# Patient Record
Sex: Male | Born: 1955 | Race: Black or African American | Hispanic: No | Marital: Married | Smoking: Former smoker
Health system: Southern US, Community
[De-identification: ages and names within clinical notes are randomized; demographics above are authoritative.]

## PROBLEM LIST (undated history)

## (undated) DIAGNOSIS — I639 Cerebral infarction, unspecified: Secondary | ICD-10-CM

## (undated) DIAGNOSIS — I739 Peripheral vascular disease, unspecified: Secondary | ICD-10-CM

## (undated) DIAGNOSIS — N183 Chronic kidney disease, stage 3 unspecified: Secondary | ICD-10-CM

## (undated) DIAGNOSIS — R0600 Dyspnea, unspecified: Secondary | ICD-10-CM

## (undated) DIAGNOSIS — N189 Chronic kidney disease, unspecified: Secondary | ICD-10-CM

## (undated) DIAGNOSIS — E119 Type 2 diabetes mellitus without complications: Secondary | ICD-10-CM

## (undated) DIAGNOSIS — E785 Hyperlipidemia, unspecified: Secondary | ICD-10-CM

## (undated) DIAGNOSIS — I10 Essential (primary) hypertension: Secondary | ICD-10-CM

## (undated) DIAGNOSIS — I429 Cardiomyopathy, unspecified: Secondary | ICD-10-CM

## (undated) DIAGNOSIS — E039 Hypothyroidism, unspecified: Secondary | ICD-10-CM

## (undated) HISTORY — PX: TOE AMPUTATION: SHX809

## (undated) HISTORY — PX: BACK SURGERY: SHX140

---

## 2004-05-21 ENCOUNTER — Ambulatory Visit: Payer: Self-pay | Admitting: Internal Medicine

## 2004-06-25 ENCOUNTER — Ambulatory Visit: Payer: Self-pay | Admitting: Internal Medicine

## 2004-09-23 ENCOUNTER — Other Ambulatory Visit: Payer: Self-pay

## 2004-09-23 ENCOUNTER — Emergency Department: Payer: Self-pay | Admitting: Emergency Medicine

## 2004-10-01 ENCOUNTER — Ambulatory Visit: Payer: Self-pay | Admitting: Pain Medicine

## 2004-10-04 ENCOUNTER — Ambulatory Visit: Payer: Self-pay | Admitting: Pain Medicine

## 2004-10-05 ENCOUNTER — Ambulatory Visit: Payer: Self-pay | Admitting: Pain Medicine

## 2004-10-16 ENCOUNTER — Ambulatory Visit: Payer: Self-pay | Admitting: Pain Medicine

## 2004-11-05 ENCOUNTER — Ambulatory Visit: Payer: Self-pay | Admitting: Physician Assistant

## 2004-11-06 ENCOUNTER — Ambulatory Visit: Payer: Self-pay

## 2004-11-09 ENCOUNTER — Ambulatory Visit: Payer: Self-pay | Admitting: Internal Medicine

## 2004-11-13 ENCOUNTER — Ambulatory Visit: Payer: Self-pay | Admitting: Pain Medicine

## 2004-11-27 ENCOUNTER — Ambulatory Visit: Payer: Self-pay | Admitting: Pain Medicine

## 2004-12-11 ENCOUNTER — Ambulatory Visit: Payer: Self-pay | Admitting: Physician Assistant

## 2004-12-18 ENCOUNTER — Ambulatory Visit: Payer: Self-pay | Admitting: Pain Medicine

## 2004-12-20 ENCOUNTER — Ambulatory Visit: Payer: Self-pay | Admitting: Internal Medicine

## 2004-12-31 ENCOUNTER — Ambulatory Visit: Payer: Self-pay | Admitting: Physician Assistant

## 2005-01-09 ENCOUNTER — Encounter: Payer: Self-pay | Admitting: Pain Medicine

## 2005-01-10 ENCOUNTER — Ambulatory Visit: Payer: Self-pay | Admitting: Pain Medicine

## 2005-02-06 ENCOUNTER — Ambulatory Visit: Payer: Self-pay | Admitting: Physician Assistant

## 2005-03-08 ENCOUNTER — Ambulatory Visit: Payer: Self-pay | Admitting: Physician Assistant

## 2005-03-27 ENCOUNTER — Other Ambulatory Visit: Payer: Self-pay

## 2005-04-02 ENCOUNTER — Inpatient Hospital Stay: Payer: Self-pay | Admitting: Unknown Physician Specialty

## 2005-04-25 ENCOUNTER — Emergency Department: Payer: Self-pay | Admitting: Internal Medicine

## 2005-04-26 ENCOUNTER — Other Ambulatory Visit: Payer: Self-pay

## 2005-06-30 ENCOUNTER — Other Ambulatory Visit: Payer: Self-pay

## 2005-06-30 ENCOUNTER — Emergency Department: Payer: Self-pay | Admitting: Internal Medicine

## 2005-07-08 ENCOUNTER — Ambulatory Visit: Payer: Self-pay | Admitting: Physician Assistant

## 2005-07-11 ENCOUNTER — Ambulatory Visit: Payer: Self-pay | Admitting: Pain Medicine

## 2005-07-17 ENCOUNTER — Ambulatory Visit: Payer: Self-pay | Admitting: Physician Assistant

## 2005-07-26 ENCOUNTER — Ambulatory Visit: Payer: Self-pay | Admitting: Physician Assistant

## 2005-09-10 ENCOUNTER — Encounter: Payer: Self-pay | Admitting: Unknown Physician Specialty

## 2005-09-19 ENCOUNTER — Encounter: Payer: Self-pay | Admitting: Unknown Physician Specialty

## 2005-10-20 ENCOUNTER — Encounter: Payer: Self-pay | Admitting: Unknown Physician Specialty

## 2005-10-23 ENCOUNTER — Ambulatory Visit: Payer: Self-pay | Admitting: Physician Assistant

## 2005-11-15 ENCOUNTER — Ambulatory Visit: Payer: Self-pay | Admitting: Internal Medicine

## 2005-11-19 ENCOUNTER — Encounter: Payer: Self-pay | Admitting: Unknown Physician Specialty

## 2005-12-02 ENCOUNTER — Other Ambulatory Visit: Payer: Self-pay

## 2005-12-27 ENCOUNTER — Ambulatory Visit: Payer: Self-pay | Admitting: Podiatry

## 2006-02-12 ENCOUNTER — Ambulatory Visit: Payer: Self-pay | Admitting: Internal Medicine

## 2006-02-20 ENCOUNTER — Ambulatory Visit: Payer: Self-pay | Admitting: Physician Assistant

## 2006-03-26 ENCOUNTER — Ambulatory Visit: Payer: Self-pay | Admitting: Internal Medicine

## 2006-12-13 IMAGING — CR DG CHEST 2V
1 series · 2 of 2 positions shown · non-contrast
Comparison: none

REASON FOR EXAM: Diabetes
COMMENTS:

PROCEDURE:     DXR - DXR CHEST PA (OR AP) AND LATERAL  - March 27, 2005  [DATE]
RESULT:     Two views of the chest show the lungs are clear.  The heart and
pulmonary vessels are normal.  The bony and mediastinal structures are
unremarkable.  There is no significant change since 09/23/04.

[Series 1312: postero_anterior · 0.11mm/px · 2 of 2 slices shown]
[im 1/2]
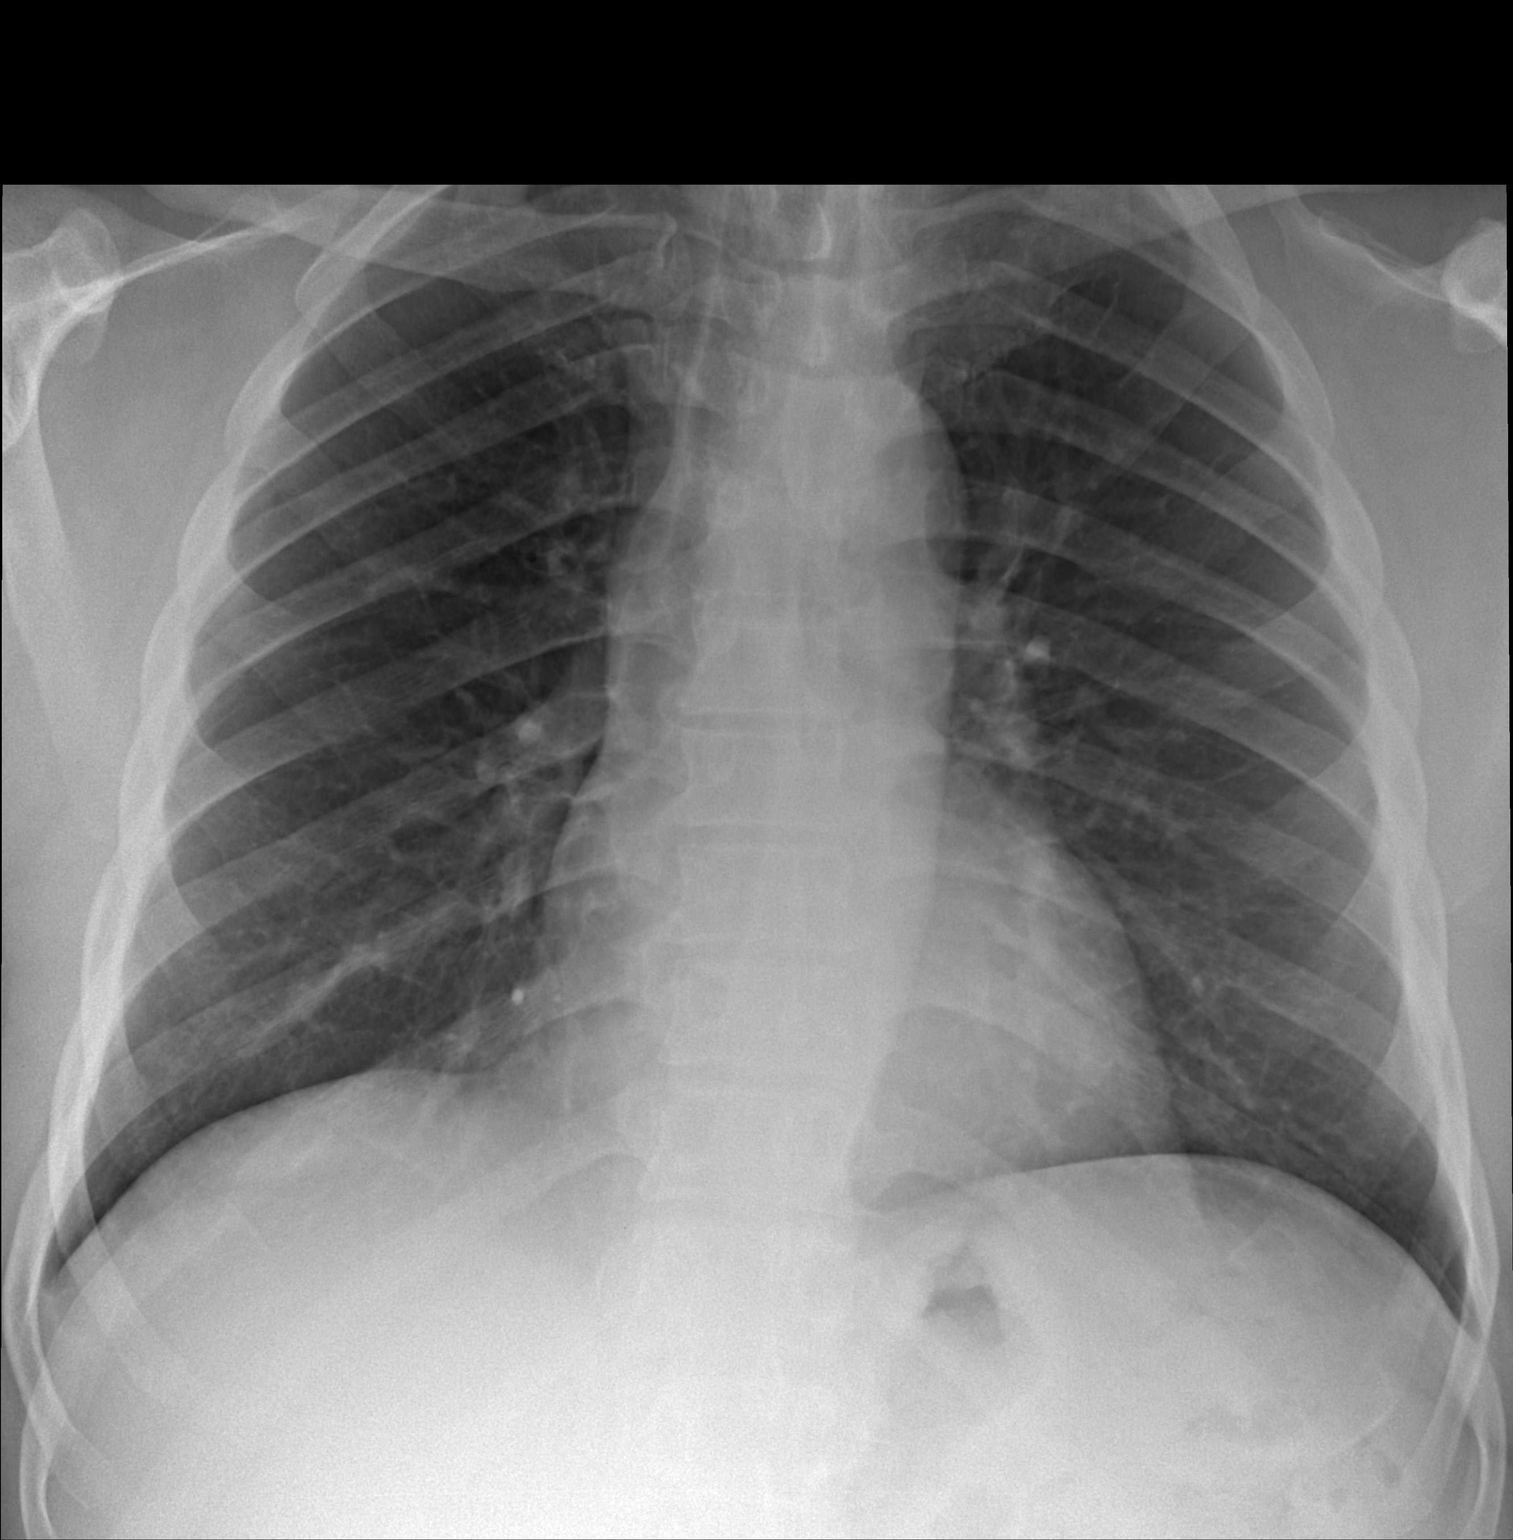
[im 2/2]
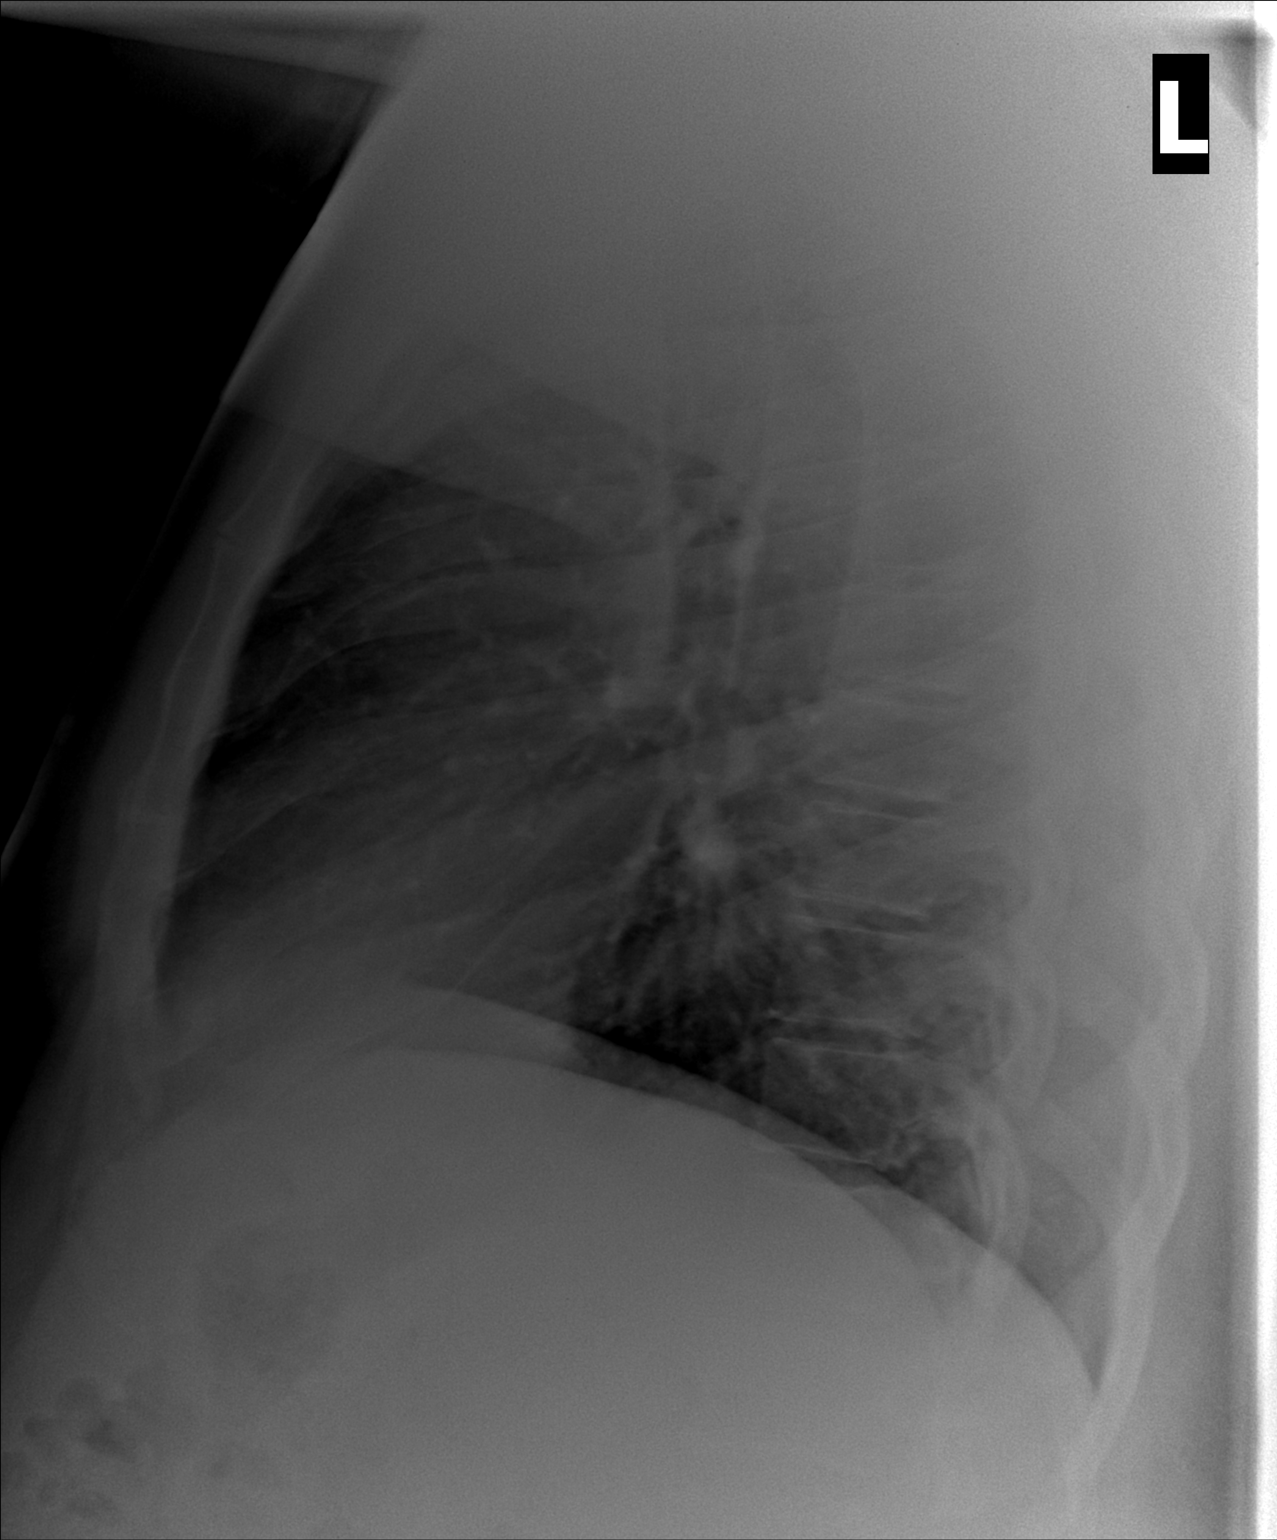

[2 of 2 positions shown; findings below may reference images not displayed]

IMPRESSION: No acute cardiopulmonary disease.

## 2007-08-03 IMAGING — RF DG UGI W/O KUB
2 series · 15 of 19 positions shown · non-contrast
Comparison: none

REASON FOR EXAM: Dysphagia
COMMENTS:

[Series 1: run · 14 of 18 slices shown]
[im 1/18]
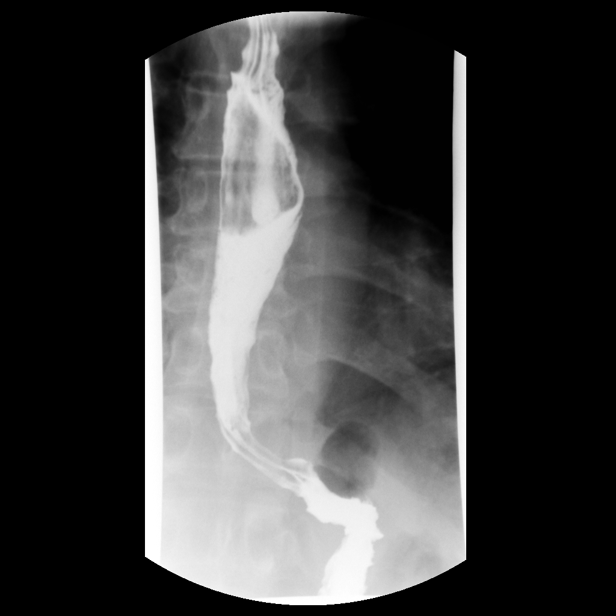
[im 2/18]
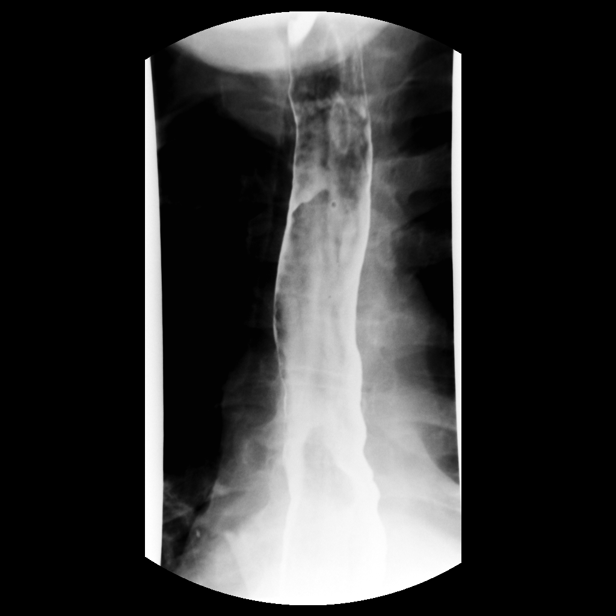
[im 4/18]
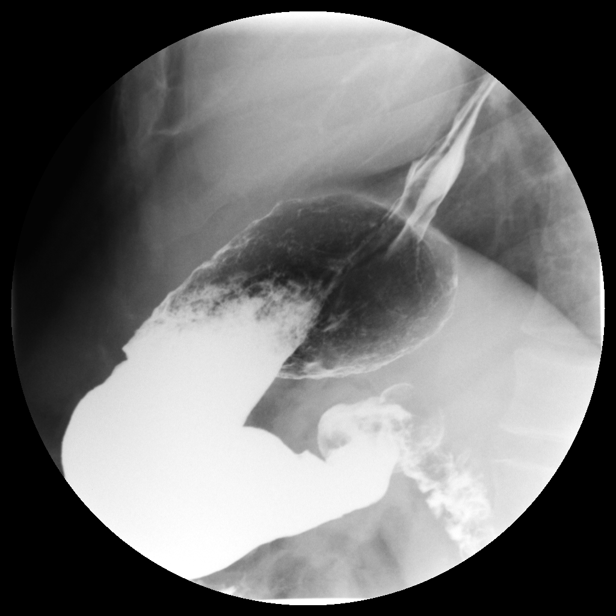
[im 5/18]
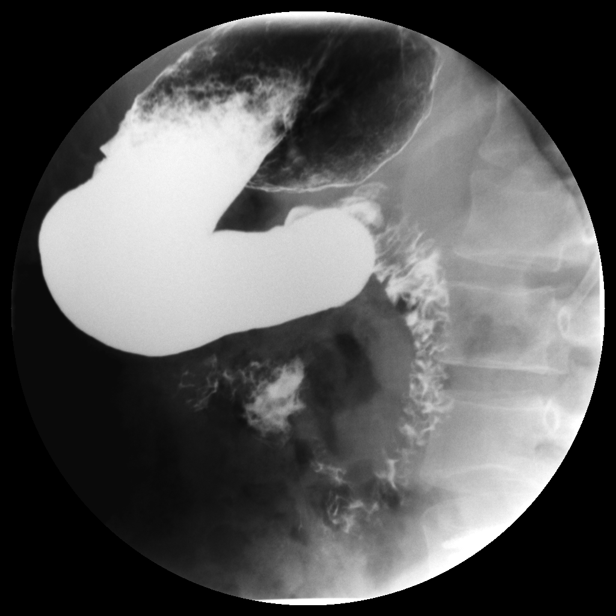
[im 6/18]
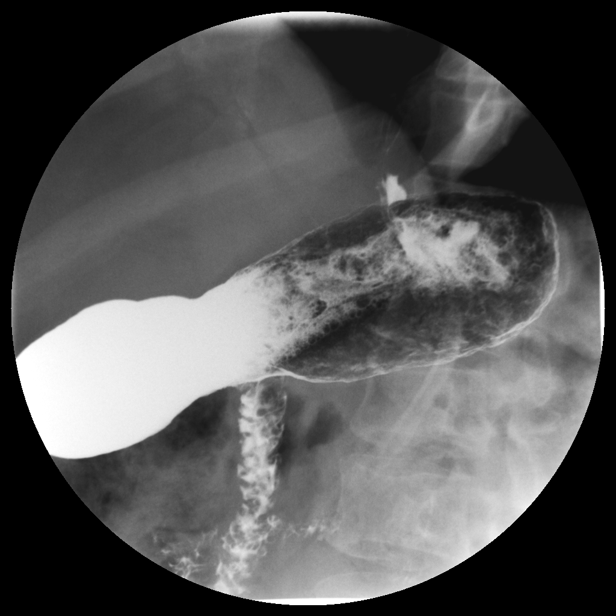
[im 7/18]
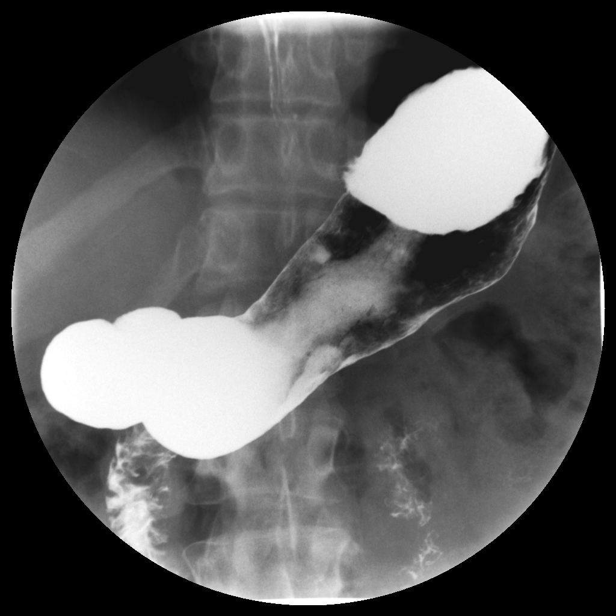
[im 9/18]
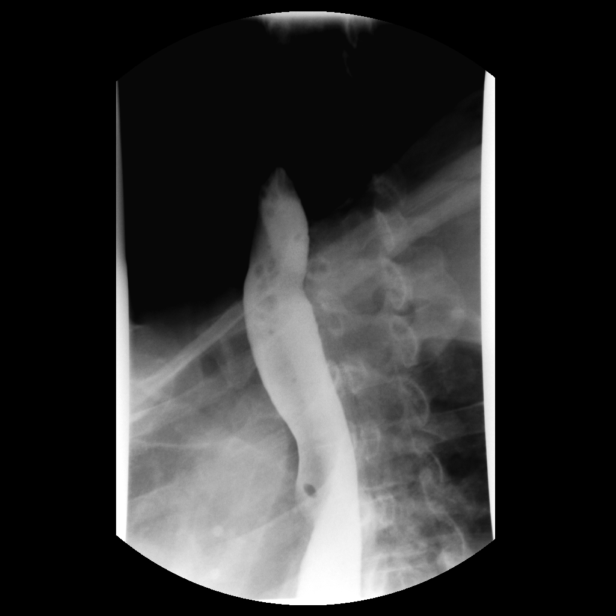
[im 10/18]
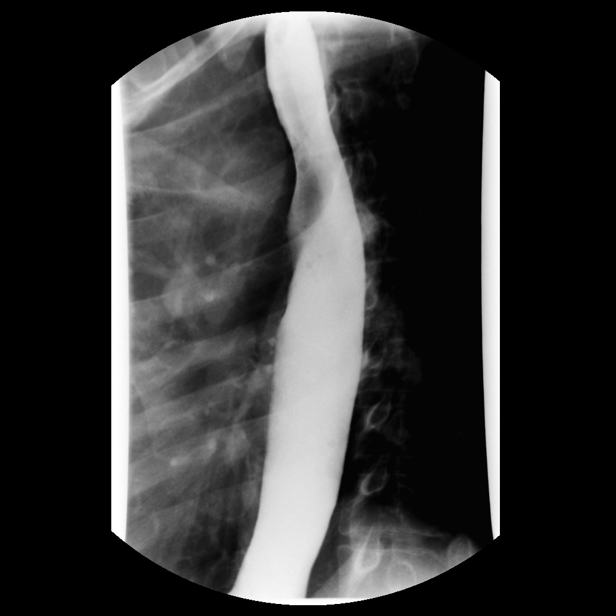
[im 11/18]
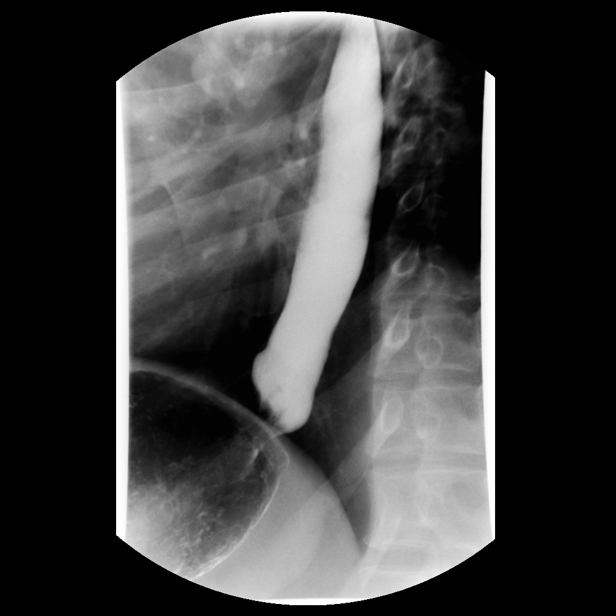
[im 13/18]
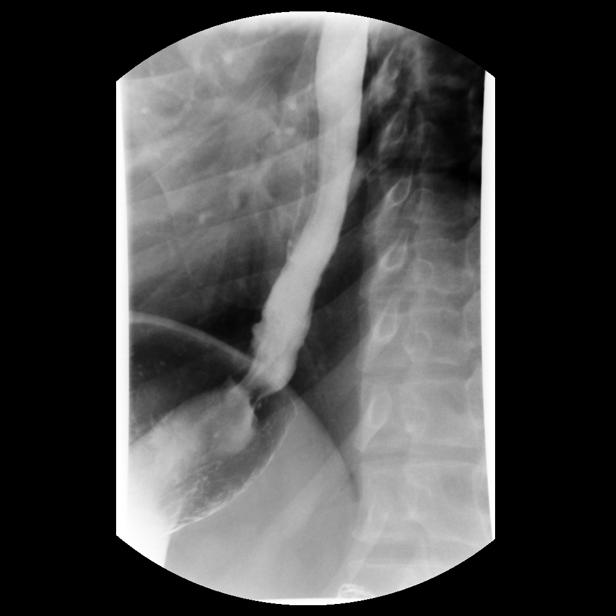
[im 14/18]
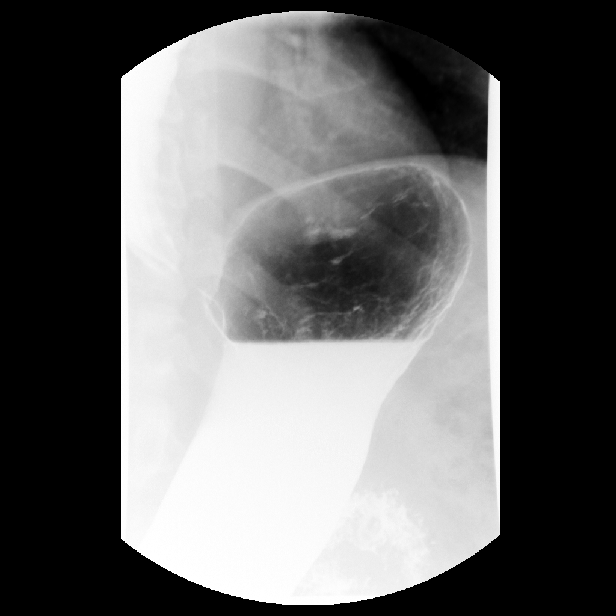
[im 15/18]
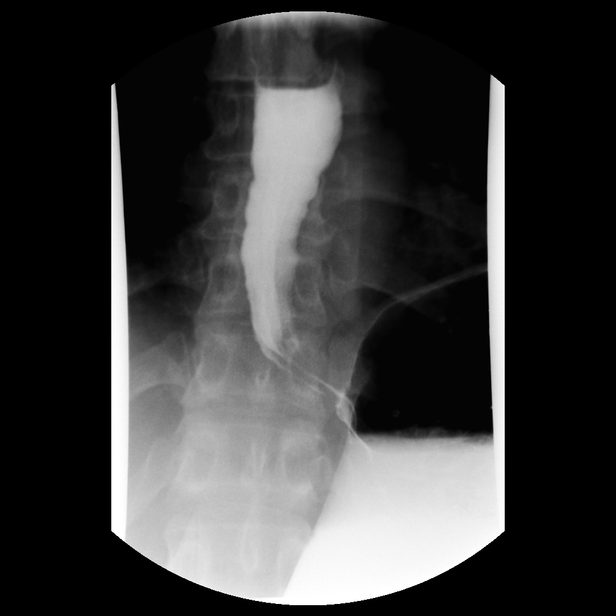
[im 16/18]
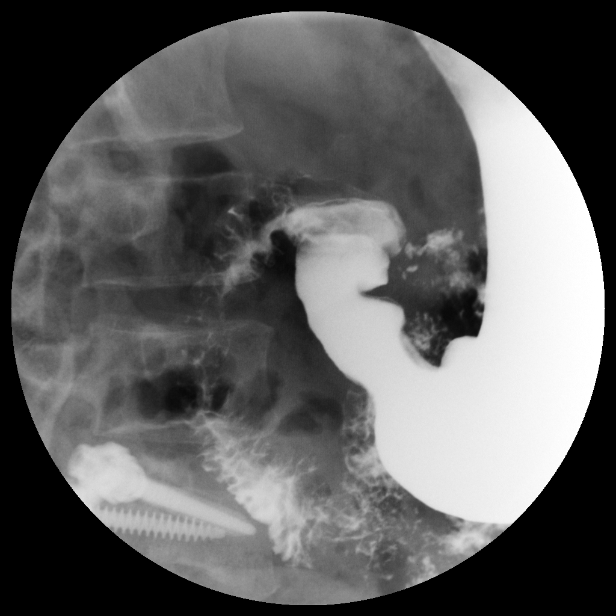
[im 18/18]
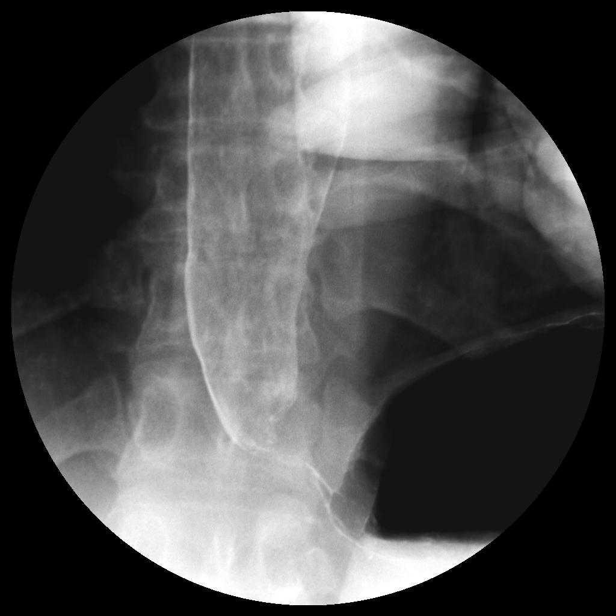

[Series 1: view not recorded · 0.17mm/px · 1 of 1 slices shown]
[im 1/1]
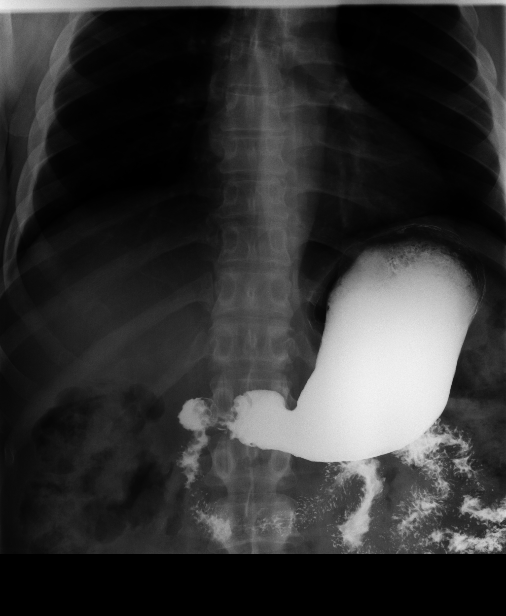

[15 of 19 positions shown; findings below may reference images not displayed]

PROCEDURE:     FL  - FL UPPER GI  - November 15, 2005  [DATE]

RESULT:          Double contrast upper GI examination demonstrates no
evidence of hiatal hernia or gastroesophageal reflux.  The esophagus is
mildly distended.  There was significant spasm in the distal esophagus to
mid esophagus, but no definite mucosal abnormality is seen.  A 12.5 mm
barium-impregnated tablet passed easily through the esophagus into the
stomach, demonstrating no significant stenosis.  The study shows some
residual ingested food within the stomach which could represent some delayed
gastric transit.  Correlation with the history would be recommended.
IMPRESSION: Possible delayed gastric emptying, given there appeared to
be some residual gastric content present.  There is some spasm in the distal
esophagus to mid esophageal region, but a 12.5 mm barium-impregnated tablet
passed into the stomach without delay.

## 2008-05-23 ENCOUNTER — Emergency Department: Payer: Self-pay | Admitting: Emergency Medicine

## 2009-12-07 ENCOUNTER — Ambulatory Visit: Payer: Self-pay | Admitting: Podiatry

## 2010-12-06 ENCOUNTER — Ambulatory Visit: Payer: Self-pay | Admitting: Ophthalmology

## 2010-12-19 ENCOUNTER — Ambulatory Visit: Payer: Self-pay | Admitting: Ophthalmology

## 2011-01-22 ENCOUNTER — Ambulatory Visit: Payer: Self-pay | Admitting: Ophthalmology

## 2011-05-28 ENCOUNTER — Ambulatory Visit: Payer: Self-pay | Admitting: Ophthalmology

## 2011-05-29 ENCOUNTER — Inpatient Hospital Stay: Payer: Self-pay | Admitting: Podiatry

## 2012-02-04 ENCOUNTER — Ambulatory Visit: Payer: Self-pay | Admitting: Ophthalmology

## 2012-02-12 ENCOUNTER — Ambulatory Visit: Payer: Self-pay | Admitting: Ophthalmology

## 2013-12-06 DIAGNOSIS — R0602 Shortness of breath: Secondary | ICD-10-CM | POA: Insufficient documentation

## 2014-06-02 DIAGNOSIS — E0590 Thyrotoxicosis, unspecified without thyrotoxic crisis or storm: Secondary | ICD-10-CM | POA: Insufficient documentation

## 2014-11-08 NOTE — Op Note (Signed)
PATIENT NAME:  Bruce Bartlett, Bruce Bartlett MR#:  [id number] DATE OF BIRTH:  05-Feb-1956  DATE OF PROCEDURE:  02/12/2012  PROCEDURES PERFORMED:  1. Pars plana vitrectomy of the right eye.  2. Endolaser of the right eye.  3. Tractional retinal detachment repair of the right eye.  4. Phacoemulsification intraocular lens insertion of the right eye.  5. Gas exchange of the right eye.   PREOPERATIVE DIAGNOSES:  1. Proliferative diabetic retinopathy.  2. Epiretinal membrane.  3. Visually significant cataract.   POSTOPERATIVE DIAGNOSES:  1. Proliferative diabetic retinopathy.  2. Tractional retinal detachment.  3. Visually significant cataract.  PRIMARY SURGEON: Bruce Bartlett. Bruce Buege, MD   ANESTHESIA: Retrobulbar block of the right eye with monitored anesthesia care.   COMPLICATIONS: None.   INDICATIONS FOR PROCEDURE: This is a patient who has been under my care for several months. The patient has presented with proliferative diabetic retinopathy of the right eye. The patient underwent panretinal photocoagulation with development of a tractional retinal detachment in response. Risks, benefits, and alternatives of the above procedure were discussed and the patient wished to proceed.   DETAILS OF PROCEDURE: After informed consent was obtained, the patient was brought into the operative suite at [facility]. The patient was placed in the supine position and was given a small dose of propofol and a retrobulbar block was performed on the right eye by the primary surgeon without any complications. The right eye was prepped and draped in a sterile manner. After lid speculum was inserted, a side-port wound was created at approximately 10:30 in a clear corneal fashion. DisCoVisc was injected into the anterior chamber to maintain it. Keratome blade was used to create a main corneal wound at approximately 12 o'clock. Cystotome was introduced in the eye and a continuous 360 degree anterior  capsulorrhexis was created. The lens was hydrodissected using BSS on a 26-gauge cannula and rotated for 180 degrees. Phacoemulsification wand was introduced in the eye and the eye was broken into four quadrants and removed without any complications. INA was used to remove the cortical material without any complication. DisCoVisc was injected into the capsular bag. A 15 diopter SN60WF lens, serial #[id number] was introduced into the capsular bag and rotated into position. The DisCoVisc was removed using INA and the wounds were hydrated using BSS and were noted to be watertight. Attention was turned to the pars plana vitrectomy portion of the case.   A 23-gauge trocar was placed inferotemporally through displaced conjunctiva in an oblique fashion 3 mm beyond the limbus. The infusion cannula was turned on and inserted through the trocar and secured in position with Steri-Strips. Two more trocars were placed in a similar fashion superotemporally and superonasally. The vitreous cutter and light pipe were introduced in the eye and a core vitrectomy was performed. The vitreous base was noted to be plastered to the retina for 360 degrees. The posterior hyaloid was extremely tight and elevated off of the macula. This area was carefully incised superior to the macula in order to create access to the posterior side of the vitreous base. Given the extreme tightness, there was plenty of room to do so. Using a combination of vitreous cutter, 23-gauge vertical scissors and pick, the multiple proliferative plaques were dissected and isolated. Two of them were noted to be extreme plates and were trimmed down to their smallest amount and broken into pieces but small remnants were left behind because of the risk of creating extremely large retinal tears. All traction appeared to  be alleviated. There was one small retinal tear noted inferior to the optic nerve secondary to removal of proliferative plaque. Panretinal photocoagulation  was then performed for 360 degrees. An air-fluid exchange was performed. The fluid was removed from under the original tear and three rows of laser was placed around this tear. Once this was completed, no further tears could be identified. 22% SF6 was used as an Systems developerair-gas exchange. The trocars were removed and 6-0 plain gut was used to close two of the wounds due to air leak. SF6 was used to pressurize the eye to 15 mmHg. 5 mg of dexamethasone was then given into the inferior fornix. The lid speculum was removed and then Cosopt and TobraDex was placed in the eye. The eye was cleaned. A patch and shield was placed over the eye and the patient was taken to postanesthesia care with instructions to remain face down.   ____________________________ Bruce FellingMatthew F. Champ MungoAppenzeller, MD mfa:drc D: 02/12/2012 09:30:29 ET T: 02/12/2012 10:35:04 ET JOB#: [id number] Bruce CoolsMATTHEW F Marea Reasner MD ELECTRONICALLY SIGNED 03/11/2012 12:35

## 2016-01-17 DIAGNOSIS — Z89421 Acquired absence of other right toe(s): Secondary | ICD-10-CM | POA: Insufficient documentation

## 2016-03-20 ENCOUNTER — Other Ambulatory Visit: Payer: Self-pay | Admitting: Neurology

## 2016-03-20 DIAGNOSIS — M21371 Foot drop, right foot: Secondary | ICD-10-CM

## 2016-03-20 DIAGNOSIS — N183 Chronic kidney disease, stage 3 unspecified: Secondary | ICD-10-CM | POA: Insufficient documentation

## 2016-03-20 DIAGNOSIS — R269 Unspecified abnormalities of gait and mobility: Secondary | ICD-10-CM

## 2016-04-02 ENCOUNTER — Ambulatory Visit: Payer: Self-pay

## 2016-04-03 ENCOUNTER — Ambulatory Visit
Admission: RE | Admit: 2016-04-03 | Discharge: 2016-04-03 | Disposition: A | Discharge status: Home / Self Care | Payer: Medicare Other | Source: Ambulatory Visit | Attending: Neurology | Admitting: Neurology

## 2016-04-03 DIAGNOSIS — Z8673 Personal history of transient ischemic attack (TIA), and cerebral infarction without residual deficits: Secondary | ICD-10-CM | POA: Insufficient documentation

## 2016-04-03 DIAGNOSIS — M21371 Foot drop, right foot: Secondary | ICD-10-CM | POA: Diagnosis present

## 2016-04-03 DIAGNOSIS — R269 Unspecified abnormalities of gait and mobility: Secondary | ICD-10-CM | POA: Diagnosis present

## 2016-05-18 ENCOUNTER — Encounter: Payer: Self-pay | Admitting: Emergency Medicine

## 2016-05-18 ENCOUNTER — Inpatient Hospital Stay
Admission: EM | Admit: 2016-05-18 | Discharge: 2016-05-21 | DRG: 309 | Disposition: A | Discharge status: Home / Self Care | Payer: Medicare Other | Attending: Internal Medicine | Admitting: Internal Medicine

## 2016-05-18 ENCOUNTER — Emergency Department: Payer: Medicare Other

## 2016-05-18 DIAGNOSIS — I10 Essential (primary) hypertension: Secondary | ICD-10-CM | POA: Diagnosis present

## 2016-05-18 DIAGNOSIS — Z87891 Personal history of nicotine dependence: Secondary | ICD-10-CM

## 2016-05-18 DIAGNOSIS — I69351 Hemiplegia and hemiparesis following cerebral infarction affecting right dominant side: Secondary | ICD-10-CM | POA: Diagnosis not present

## 2016-05-18 DIAGNOSIS — E785 Hyperlipidemia, unspecified: Secondary | ICD-10-CM | POA: Diagnosis present

## 2016-05-18 DIAGNOSIS — R001 Bradycardia, unspecified: Secondary | ICD-10-CM | POA: Diagnosis present

## 2016-05-18 DIAGNOSIS — Z794 Long term (current) use of insulin: Secondary | ICD-10-CM | POA: Diagnosis not present

## 2016-05-18 DIAGNOSIS — R42 Dizziness and giddiness: Secondary | ICD-10-CM

## 2016-05-18 DIAGNOSIS — Z8673 Personal history of transient ischemic attack (TIA), and cerebral infarction without residual deficits: Secondary | ICD-10-CM | POA: Diagnosis not present

## 2016-05-18 DIAGNOSIS — K5900 Constipation, unspecified: Secondary | ICD-10-CM | POA: Diagnosis present

## 2016-05-18 DIAGNOSIS — E119 Type 2 diabetes mellitus without complications: Secondary | ICD-10-CM | POA: Diagnosis present

## 2016-05-18 DIAGNOSIS — E860 Dehydration: Secondary | ICD-10-CM | POA: Diagnosis present

## 2016-05-18 DIAGNOSIS — R1110 Vomiting, unspecified: Secondary | ICD-10-CM

## 2016-05-18 DIAGNOSIS — A084 Viral intestinal infection, unspecified: Secondary | ICD-10-CM | POA: Diagnosis present

## 2016-05-18 DIAGNOSIS — N179 Acute kidney failure, unspecified: Secondary | ICD-10-CM | POA: Diagnosis present

## 2016-05-18 DIAGNOSIS — R066 Hiccough: Secondary | ICD-10-CM | POA: Diagnosis present

## 2016-05-18 HISTORY — DX: Hyperlipidemia, unspecified: E78.5

## 2016-05-18 HISTORY — DX: Type 2 diabetes mellitus without complications: E11.9

## 2016-05-18 HISTORY — DX: Cerebral infarction, unspecified: I63.9

## 2016-05-18 HISTORY — DX: Essential (primary) hypertension: I10

## 2016-05-18 LAB — CBC
HCT: 43.9 % (ref 40.0–52.0)
Hemoglobin: 14.3 g/dL (ref 13.0–18.0)
MCH: 24.1 pg — ABNORMAL LOW (ref 26.0–34.0)
MCHC: 32.5 g/dL (ref 32.0–36.0)
MCV: 74.2 fL — AB (ref 80.0–100.0)
PLATELETS: 210 10*3/uL (ref 150–440)
RBC: 5.91 MIL/uL — AB (ref 4.40–5.90)
RDW: 16.6 % — ABNORMAL HIGH (ref 11.5–14.5)
WBC: 10.7 10*3/uL — AB (ref 3.8–10.6)

## 2016-05-18 LAB — LIPASE, BLOOD: Lipase: 17 U/L (ref 11–51)

## 2016-05-18 LAB — BASIC METABOLIC PANEL
Anion gap: 8 (ref 5–15)
BUN: 30 mg/dL — ABNORMAL HIGH (ref 6–20)
CALCIUM: 9.4 mg/dL (ref 8.9–10.3)
CHLORIDE: 101 mmol/L (ref 101–111)
CO2: 28 mmol/L (ref 22–32)
CREATININE: 1.47 mg/dL — AB (ref 0.61–1.24)
GFR, EST AFRICAN AMERICAN: 58 mL/min — AB (ref >60)
GFR, EST NON AFRICAN AMERICAN: 50 mL/min — AB (ref >60)
Glucose, Bld: 347 mg/dL — ABNORMAL HIGH (ref 65–99)
Potassium: 3.8 mmol/L (ref 3.5–5.1)
SODIUM: 137 mmol/L (ref 135–145)

## 2016-05-18 LAB — URINALYSIS COMPLETE WITH MICROSCOPIC (ARMC ONLY)
BILIRUBIN URINE: NEGATIVE
Bacteria, UA: NONE SEEN
KETONES UR: NEGATIVE mg/dL
LEUKOCYTES UA: NEGATIVE
NITRITE: NEGATIVE
Protein, ur: >500 mg/dL — AB
SPECIFIC GRAVITY, URINE: 1.025 (ref 1.005–1.030)
pH: 5 (ref 5.0–8.0)

## 2016-05-18 LAB — GLUCOSE, CAPILLARY
GLUCOSE-CAPILLARY: 228 mg/dL — AB (ref 65–99)
Glucose-Capillary: 347 mg/dL — ABNORMAL HIGH (ref 65–99)

## 2016-05-18 LAB — TROPONIN I
TROPONIN I: 0.08 ng/mL — AB (ref <0.03)
Troponin I: 0.07 ng/mL (ref <0.03)

## 2016-05-18 MED ORDER — SODIUM CHLORIDE 0.9 % IV BOLUS (SEPSIS)
1000.0000 mL | Freq: Once | INTRAVENOUS | Status: AC
  Administered 2016-05-18: 1000 mL via INTRAVENOUS

## 2016-05-18 MED ORDER — ONDANSETRON 4 MG PO TBDP
ORAL_TABLET | ORAL | Status: AC
  Filled 2016-05-18: qty 1

## 2016-05-18 MED ORDER — ONDANSETRON 4 MG PO TBDP
4.0000 mg | ORAL_TABLET | Freq: Once | ORAL | Status: AC | PRN
  Administered 2016-05-18: 4 mg via ORAL

## 2016-05-18 MED ORDER — INSULIN ASPART 100 UNIT/ML ~~LOC~~ SOLN
6.0000 [IU] | Freq: Once | SUBCUTANEOUS | Status: AC
  Administered 2016-05-18: 6 [IU] via INTRAVENOUS
  Filled 2016-05-18: qty 6

## 2016-05-18 NOTE — ED Triage Notes (Signed)
Pt arrived via POV with a friend. Pt states yesterday he has been dizzy and weak since last night. Pt reports vomiting x 3 today. Denies any pain.  Pt is a diabetic. Pt states his last blood sugar was 259 this afternoon and he did take insulin then.  Pt states dizziness is worse when lying down.

## 2016-05-18 NOTE — ED Notes (Signed)
Patient given diet ginger ale per request.  

## 2016-05-18 NOTE — ED Provider Notes (Signed)
[facility] Emergency Department Provider Note  Time seen: 8:02 PM  I have reviewed the triage vital signs and the nursing notes.   HISTORY  Chief Complaint Dizziness and Weakness    HPI Bruce Bartlett is a 60 y.o. male with a past medical history of diabetes, hyperlipidemia, hypertension, CVA affecting the right lower extremity who presents the emergency department for generalized weakness and dizziness. According to the patient for the past2-3 days he has been feeling generalized weakness and dizziness. States he uses a cane at baseline due to a prior stroke and right lower extremity weakness, but states he has been feeling more off-balance recently. He is also been having trouble controlling his blood sugars. Denies any new focal weakness or numbness. Denies any headache. Patient is quite hypertensive in the emergency department, states he takes blood pressure medications and has not missed any doses recently. Patient describes nausea, had 3 episodes of vomiting today. Denies any abdominal pain or diarrhea. Denies dysuria but states he has been urinating very frequently.  Past Medical History:  Diagnosis Date  . Diabetes mellitus without complication (HCC)   . Hyperlipidemia   . Hypertension   . Stroke [facility](HCC)     There are no active problems to display for this patient.   Past Surgical History:  Procedure Laterality Date  . BACK SURGERY      Prior to Admission medications   Not on File    No Known Allergies  History reviewed. No pertinent family history.  Social History Social History  Substance Use Topics  . Smoking status: Former Games developermoker  . Smokeless tobacco: Never Used  . Alcohol use No    Review of Systems Constitutional: Negative for fever. Cardiovascular: Negative for chest pain. Respiratory: Negative for shortness of breath. Gastrointestinal: Negative for abdominal pain Neurological: Negative for headache 10-point ROS otherwise  negative.  ____________________________________________   PHYSICAL EXAM:  VITAL SIGNS: ED Triage Vitals  Enc Vitals Group     BP 05/18/16 1758 (S) (!) 215/82     Pulse Rate 05/18/16 1758 68     Resp 05/18/16 1758 18     Temp 05/18/16 1758 98.4 F (36.9 C)     Temp Source 05/18/16 1758 Oral     SpO2 05/18/16 1758 100 %     Weight 05/18/16 1758 249 lb (112.9 kg)     Height 05/18/16 1758 6\' 1"  (1.854 m)     Head Circumference --      Peak Flow --      Pain Score 05/18/16 1759 0     Pain Loc --      Pain Edu? --      Excl. in GC? --     Constitutional: Alert and oriented. Well appearing and in no distress. Eyes: Normal exam ENT   Head: Normocephalic and atraumatic.   Mouth/Throat: Mucous membranes are moist. Cardiovascular: Normal rate, regular rhythm. No murmur Respiratory: Normal respiratory effort without tachypnea nor retractions. Breath sounds are clear  Gastrointestinal: Soft and nontender. No distention. Musculoskeletal: Nontender with normal range of motion in all extremities. Neurologic:  Normal speech and language. 4/5 strength in right lower extremity, otherwise 5/5 motor in all extremities. No pronator drift. Patient does have right lower extremity drift which again is baseline. No cranial nerve deficits. Skin:  Skin is warm, dry and intact.  Psychiatric: Mood and affect are normal.   ____________________________________________    EKG  EKG reviewed and interpreted by myself shows sinus rhythm at  60 bpm, narrow QRS, left axis deviation, largely normal intervals besides a prolonged PR interval consistent with first-degree AV block, nonspecific but no concerning ST changes.  ____________________________________________    RADIOLOGY  CT head negative for acute intracranial abnormality  ____________________________________________   INITIAL IMPRESSION / ASSESSMENT AND PLAN / ED COURSE  Pertinent labs & imaging results that were available during my  care of the patient were reviewed by me and considered in my medical decision making (see chart for details).  The patient presents the emergency department for dizziness and generalized weakness. Patient is hypertensive in the emergency department. We'll obtain a CT scan of the head. On exam the patient does have right lower extremity weakness as his only deficit which she states is baseline. Patient's labs show an elevated blood glucose. We will dose insulin, IV fluids and closely monitor in the emergency department. The patient is agreeable to this plan. I also added on a troponin.  Patient's troponin is elevated 0.07. Blood glucose is down to 228.  Patient continues to appear well the emergency department. Patient did however have a brief run of bradycardia in the 30s, and then a sustained bradycardia in the low to mid 40s. This could very likely be the cause of the patient's weakness/dizziness. Given his bradycardia and possible symptomatic bradycardia will admit to the hospital for further workup and treatment. At this time it is unclear what medications the patient takes at home, this could possibly be overmedication with a beta or calcium channel blocker.  ____________________________________________   FINAL CLINICAL IMPRESSION(S) / ED DIAGNOSES  Dizziness  Generalized weakness Symptomatic bradycardia   Minna AntisKevin Melvina Pangelinan, MD 05/18/16 2155

## 2016-05-19 ENCOUNTER — Inpatient Hospital Stay: Payer: Medicare Other

## 2016-05-19 ENCOUNTER — Inpatient Hospital Stay
Admit: 2016-05-19 | Discharge: 2016-05-19 | Disposition: A | Discharge status: Home / Self Care | Payer: Medicare Other | Attending: Family Medicine | Admitting: Family Medicine

## 2016-05-19 LAB — TROPONIN I
TROPONIN I: 0.08 ng/mL — AB (ref <0.03)
TROPONIN I: 0.08 ng/mL — AB (ref <0.03)
TROPONIN I: 0.05 ng/mL — AB (ref <0.03)

## 2016-05-19 LAB — GLUCOSE, CAPILLARY
GLUCOSE-CAPILLARY: 259 mg/dL — AB (ref 65–99)
GLUCOSE-CAPILLARY: 167 mg/dL — AB (ref 65–99)
GLUCOSE-CAPILLARY: 155 mg/dL — AB (ref 65–99)
GLUCOSE-CAPILLARY: 180 mg/dL — AB (ref 65–99)
Glucose-Capillary: 227 mg/dL — ABNORMAL HIGH (ref 65–99)
Glucose-Capillary: 248 mg/dL — ABNORMAL HIGH (ref 65–99)
Glucose-Capillary: 269 mg/dL — ABNORMAL HIGH (ref 65–99)

## 2016-05-19 LAB — BASIC METABOLIC PANEL
ANION GAP: 8 (ref 5–15)
BUN: 30 mg/dL — ABNORMAL HIGH (ref 6–20)
CALCIUM: 8.9 mg/dL (ref 8.9–10.3)
CHLORIDE: 103 mmol/L (ref 101–111)
CO2: 26 mmol/L (ref 22–32)
Creatinine, Ser: 1.48 mg/dL — ABNORMAL HIGH (ref 0.61–1.24)
GFR calc Af Amer: 57 mL/min — ABNORMAL LOW (ref >60)
GFR calc non Af Amer: 49 mL/min — ABNORMAL LOW (ref >60)
GLUCOSE: 243 mg/dL — AB (ref 65–99)
Potassium: 3.7 mmol/L (ref 3.5–5.1)
Sodium: 137 mmol/L (ref 135–145)

## 2016-05-19 LAB — CBC
HCT: 42.1 % (ref 40.0–52.0)
HEMOGLOBIN: 14.1 g/dL (ref 13.0–18.0)
MCH: 24.9 pg — AB (ref 26.0–34.0)
MCHC: 33.4 g/dL (ref 32.0–36.0)
MCV: 74.5 fL — AB (ref 80.0–100.0)
Platelets: 221 10*3/uL (ref 150–440)
RBC: 5.65 MIL/uL (ref 4.40–5.90)
RDW: 16.5 % — ABNORMAL HIGH (ref 11.5–14.5)
WBC: 10.4 10*3/uL (ref 3.8–10.6)

## 2016-05-19 LAB — PROTIME-INR
INR: 1.23
Prothrombin Time: 15.6 seconds — ABNORMAL HIGH (ref 11.4–15.2)

## 2016-05-19 LAB — LIPID PANEL
CHOL/HDL RATIO: 3.5 ratio
CHOLESTEROL: 147 mg/dL (ref 0–200)
HDL: 42 mg/dL (ref >40)
LDL Cholesterol: 93 mg/dL (ref 0–99)
Triglycerides: 61 mg/dL (ref <150)
VLDL: 12 mg/dL (ref 0–40)

## 2016-05-19 LAB — TSH: TSH: 1.15 u[IU]/mL (ref 0.350–4.500)

## 2016-05-19 LAB — HEPARIN LEVEL (UNFRACTIONATED): HEPARIN UNFRACTIONATED: 0.8 [IU]/mL — AB (ref 0.30–0.70)

## 2016-05-19 LAB — MAGNESIUM: Magnesium: 2.1 mg/dL (ref 1.7–2.4)

## 2016-05-19 LAB — ECHOCARDIOGRAM COMPLETE
HEIGHTINCHES: 73 in
Weight: 3755.2 oz

## 2016-05-19 LAB — APTT: aPTT: 31 seconds (ref 24–36)

## 2016-05-19 LAB — PHOSPHORUS: Phosphorus: 3.6 mg/dL (ref 2.5–4.6)

## 2016-05-19 MED ORDER — ATORVASTATIN CALCIUM 20 MG PO TABS
40.0000 mg | ORAL_TABLET | Freq: Every day | ORAL | Status: DC
  Administered 2016-05-19 – 2016-05-21 (×3): 40 mg via ORAL
  Filled 2016-05-19 (×3): qty 2

## 2016-05-19 MED ORDER — HYDRALAZINE HCL 50 MG PO TABS
50.0000 mg | ORAL_TABLET | Freq: Three times a day (TID) | ORAL | Status: DC
  Administered 2016-05-19: 50 mg via ORAL
  Filled 2016-05-19: qty 1

## 2016-05-19 MED ORDER — BISACODYL 5 MG PO TBEC: 5.0000 mg | DELAYED_RELEASE_TABLET | Freq: Every day | ORAL | Status: DC | PRN

## 2016-05-19 MED ORDER — ACETAMINOPHEN 325 MG PO TABS: 650.0000 mg | ORAL_TABLET | Freq: Four times a day (QID) | ORAL | Status: DC | PRN

## 2016-05-19 MED ORDER — HYDROCODONE-ACETAMINOPHEN 5-325 MG PO TABS: 1.0000 | ORAL_TABLET | ORAL | Status: DC | PRN

## 2016-05-19 MED ORDER — HYDRALAZINE HCL 50 MG PO TABS
100.0000 mg | ORAL_TABLET | Freq: Four times a day (QID) | ORAL | Status: DC
  Administered 2016-05-19 – 2016-05-21 (×7): 100 mg via ORAL
  Filled 2016-05-19 (×7): qty 2

## 2016-05-19 MED ORDER — OMEGA-3-ACID ETHYL ESTERS 1 G PO CAPS
1.0000 g | ORAL_CAPSULE | Freq: Every day | ORAL | Status: DC
  Administered 2016-05-19 – 2016-05-21 (×3): 1 g via ORAL
  Filled 2016-05-19 (×3): qty 1

## 2016-05-19 MED ORDER — INSULIN ASPART 100 UNIT/ML ~~LOC~~ SOLN
0.0000 [IU] | Freq: Four times a day (QID) | SUBCUTANEOUS | Status: DC
  Administered 2016-05-19: 3 [IU] via SUBCUTANEOUS
  Administered 2016-05-19: 100 [IU] via SUBCUTANEOUS
  Administered 2016-05-19: 2 [IU] via SUBCUTANEOUS
  Administered 2016-05-20: 9 [IU] via SUBCUTANEOUS
  Administered 2016-05-20: 3 [IU] via SUBCUTANEOUS
  Filled 2016-05-19: qty 3
  Filled 2016-05-19: qty 2
  Filled 2016-05-19: qty 3
  Filled 2016-05-19: qty 9
  Filled 2016-05-19: qty 2

## 2016-05-19 MED ORDER — ONDANSETRON HCL 4 MG PO TABS: 4.0000 mg | ORAL_TABLET | Freq: Four times a day (QID) | ORAL | Status: DC | PRN

## 2016-05-19 MED ORDER — HYDRALAZINE HCL 50 MG PO TABS
100.0000 mg | ORAL_TABLET | Freq: Three times a day (TID) | ORAL | Status: DC
  Administered 2016-05-19: 100 mg via ORAL
  Filled 2016-05-19: qty 2

## 2016-05-19 MED ORDER — ENOXAPARIN SODIUM 40 MG/0.4ML ~~LOC~~ SOLN
40.0000 mg | SUBCUTANEOUS | Status: DC
  Administered 2016-05-19 – 2016-05-21 (×3): 40 mg via SUBCUTANEOUS
  Filled 2016-05-19 (×3): qty 0.4

## 2016-05-19 MED ORDER — SENNOSIDES-DOCUSATE SODIUM 8.6-50 MG PO TABS: 1.0000 | ORAL_TABLET | Freq: Every evening | ORAL | Status: DC | PRN

## 2016-05-19 MED ORDER — INSULIN ASPART 100 UNIT/ML ~~LOC~~ SOLN
0.0000 [IU] | SUBCUTANEOUS | Status: DC
  Administered 2016-05-19: 100 [IU] via SUBCUTANEOUS
  Filled 2016-05-19: qty 11

## 2016-05-19 MED ORDER — ZOLPIDEM TARTRATE 5 MG PO TABS: 5.0000 mg | ORAL_TABLET | Freq: Every evening | ORAL | Status: DC | PRN

## 2016-05-19 MED ORDER — AMLODIPINE BESYLATE 10 MG PO TABS
10.0000 mg | ORAL_TABLET | Freq: Every day | ORAL | Status: DC
  Administered 2016-05-19 – 2016-05-21 (×3): 10 mg via ORAL
  Filled 2016-05-19 (×3): qty 1

## 2016-05-19 MED ORDER — LATANOPROST 0.005 % OP SOLN
1.0000 [drp] | Freq: Every day | OPHTHALMIC | Status: DC
  Administered 2016-05-19 – 2016-05-20 (×3): 1 [drp] via OPHTHALMIC
  Filled 2016-05-19: qty 2.5

## 2016-05-19 MED ORDER — SODIUM CHLORIDE 0.9 % IV SOLN
INTRAVENOUS | Status: DC
  Administered 2016-05-19: 02:00:00 via INTRAVENOUS

## 2016-05-19 MED ORDER — ENOXAPARIN SODIUM 40 MG/0.4ML ~~LOC~~ SOLN
40.0000 mg | Freq: Every day | SUBCUTANEOUS | Status: DC
  Administered 2016-05-19: 40 mg via SUBCUTANEOUS
  Filled 2016-05-19: qty 0.4

## 2016-05-19 MED ORDER — HEPARIN (PORCINE) IN NACL 100-0.45 UNIT/ML-% IJ SOLN
1200.0000 [IU]/h | INTRAMUSCULAR | Status: DC
  Administered 2016-05-19: 1200 [IU]/h via INTRAVENOUS
  Filled 2016-05-19: qty 250

## 2016-05-19 MED ORDER — HYDROCHLOROTHIAZIDE 25 MG PO TABS
25.0000 mg | ORAL_TABLET | Freq: Every day | ORAL | Status: DC
  Administered 2016-05-19 – 2016-05-21 (×3): 25 mg via ORAL
  Filled 2016-05-19 (×3): qty 1

## 2016-05-19 MED ORDER — HYDRALAZINE HCL 20 MG/ML IJ SOLN
10.0000 mg | Freq: Four times a day (QID) | INTRAMUSCULAR | Status: DC | PRN
  Administered 2016-05-19 – 2016-05-20 (×4): 10 mg via INTRAVENOUS
  Filled 2016-05-19 (×5): qty 1

## 2016-05-19 MED ORDER — ACETAMINOPHEN 650 MG RE SUPP: 650.0000 mg | Freq: Four times a day (QID) | RECTAL | Status: DC | PRN

## 2016-05-19 MED ORDER — ASPIRIN 325 MG PO TABS
325.0000 mg | ORAL_TABLET | Freq: Once | ORAL | Status: AC
  Administered 2016-05-19: 325 mg via ORAL
  Filled 2016-05-19: qty 1

## 2016-05-19 MED ORDER — VITAMIN B-12 1000 MCG PO TABS
1000.0000 ug | ORAL_TABLET | Freq: Every day | ORAL | Status: DC
  Administered 2016-05-19 – 2016-05-21 (×3): 1000 ug via ORAL
  Filled 2016-05-19 (×3): qty 1

## 2016-05-19 MED ORDER — MAGNESIUM CITRATE PO SOLN: 1.0000 | Freq: Once | ORAL | Status: DC | PRN

## 2016-05-19 MED ORDER — ISOSORBIDE MONONITRATE ER 30 MG PO TB24
30.0000 mg | ORAL_TABLET | Freq: Every day | ORAL | Status: DC
  Administered 2016-05-19 – 2016-05-21 (×3): 30 mg via ORAL
  Filled 2016-05-19 (×3): qty 1

## 2016-05-19 MED ORDER — LOSARTAN POTASSIUM 50 MG PO TABS
100.0000 mg | ORAL_TABLET | Freq: Every day | ORAL | Status: DC
  Administered 2016-05-19 – 2016-05-21 (×3): 100 mg via ORAL
  Filled 2016-05-19 (×3): qty 2

## 2016-05-19 MED ORDER — METOCLOPRAMIDE HCL 5 MG/ML IJ SOLN
5.0000 mg | Freq: Three times a day (TID) | INTRAMUSCULAR | Status: DC
  Administered 2016-05-19 – 2016-05-20 (×5): 5 mg via INTRAVENOUS
  Filled 2016-05-19 (×6): qty 2

## 2016-05-19 MED ORDER — ONDANSETRON HCL 4 MG/2ML IJ SOLN
4.0000 mg | Freq: Four times a day (QID) | INTRAMUSCULAR | Status: DC | PRN
  Administered 2016-05-19: 4 mg via INTRAVENOUS
  Filled 2016-05-19: qty 2

## 2016-05-19 NOTE — Progress Notes (Signed)
ANTICOAGULATION CONSULT NOTE - Initial Consult  Pharmacy Consult for heparin Indication: chest pain/ACS  No Known Allergies  Patient Measurements: Height: 6\' 1"  (185.4 cm) Weight: 234 lb 11.2 oz (106.5 kg) IBW/kg (Calculated) : 79.9 Heparin Dosing Weight: 103.8 kg  Vital Signs: Temp: 97.4 F (36.3 C) (10/29 0045) Temp Source: Oral (10/29 0045) BP: 192/91 (10/29 0048) Pulse Rate: 76 (10/29 0048)  Labs:  Recent Labs  05/18/16 1806 05/18/16 2133  HGB 14.3  --   HCT 43.9  --   PLT 210  --   CREATININE 1.47*  --   TROPONINI 0.07* 0.08*    Estimated Creatinine Clearance: 67.6 mL/min (by C-G formula based on SCr of 1.47 mg/dL (H)).   Medical History: Past Medical History:  Diagnosis Date  . Diabetes mellitus without complication (HCC)   . Hyperlipidemia   . Hypertension   . Stroke [facility](HCC)     Medications:  Infusions:  . sodium chloride 75 mL/hr at 05/19/16 0133  . heparin      Assessment: 60 yom cc dizziness/weakness with hx CVA HLD HTN DM, had some mild chest pain today. Troponin 0.07 >> 0.08. Pharmacy consulted to dose heparin for ACS. Patient did receive LMWH 40 mg subcutaneously at 0132 today. PTA medication list doesn't include AC.    Goal of Therapy:  Heparin level 0.3-0.7 units/ml Monitor platelets by anticoagulation protocol: Yes   Plan:  Start heparin infusion at 1200 units/hr Check anti-Xa level in 6 hours and daily while on heparin Continue to monitor H&H and platelets  Carola FrostNathan A Kharizma Lesnick, Pharm.D., BCPS Clinical Pharmacist 05/19/2016,1:55 AM

## 2016-05-19 NOTE — ED Notes (Signed)
Transporting patient to 247-2A

## 2016-05-19 NOTE — Progress Notes (Signed)
Sliding scale order was every 2 hours, DR. Willis notified to review and make changes in order not to bottom the patient's blood sugar down. New order in from Dr. Anne HahnWillis to do CBG every 4 hours. Will continue to monitor.

## 2016-05-19 NOTE — Progress Notes (Signed)
Patients BP 202/65 after hydralazine. MD notified. Another dose of hydralazine ordered. Will give second dose to patient and recheck. Patient in no acute distress and is not having any pain at this time. Patient is having hiccups.

## 2016-05-19 NOTE — Progress Notes (Signed)
Patient is admitted to room 247 with the diagnosis of symptomatic  Bradycardia. Alert and  oriented x 4. Denied any acute pain. No respiratory distress noted. [facility], skin warm, intact and dry to touch. Marcel RN and HomerAmanda NT called CCMD to verify the cardiac box. Password was set up and fall contract also signed. Patient voiced no complaint. Will continue to monitor.

## 2016-05-19 NOTE — Clinical Social Work Note (Signed)
CSW received consult for medication assistance. CSW informed RNCM. CSW signing off.  Argentina PonderKaren Martha Laurine Kuyper, MSW, LCSW-A [phone]

## 2016-05-19 NOTE — Progress Notes (Addendum)
Patient's BP 198/73. MD notified. Orders given to change hydralazine PO  to 100mg  every 8 hours and to give dose now. Will give dose of hydralazine and continue to monitor. Patient continues to have hiccups. IV metoclopramide given and MD aware.

## 2016-05-19 NOTE — Progress Notes (Signed)
Patients BP 219/76. MD notified. Hydralazine ordered. Will give hydralazine then recheck BP and continue to monitor.

## 2016-05-19 NOTE — Progress Notes (Signed)
Patient seen and examined.  Continues to have hiccups with gagging. No vomiting. Extremely nauseous.  Had one episode of heart rate in the 30s with second-degree AV block. Asymptomatic.  Blood pressure elevated but her blood sugars improved.  Labs reviewed. Lipase normal. TSH normal.  * Malignant hypertension This is the likely cause of mild elevation of troponin. Echocardiogram shows no wall motion abnormalities. Continue home medications. Stat dose of IV hydralazine. Add oral hydralazine scheduled. Continue amlodipine, ACE inhibitor, Imdur.  * First-degree and second-degree AV block No medications that cause bradycardia. Discussed with Dr. Juliann Paresallwood of cardiology who will see the patient. Normal TSH.  * Diabetes mellitus Improved blood sugars and home dose  Time spent 40 minutes.

## 2016-05-19 NOTE — Progress Notes (Signed)
Patients BP 186/75 upon recheck. MD notified and verbal orders given to give another dose of IV hydralazine. Hydralazine given. Will continue to monitor.

## 2016-05-19 NOTE — H&P (Signed)
[facility] -  @ [facility] Admission History and Physical Tonye RoyaltyAlexis Jvion Turgeon, D.O.  ---------------------------------------------------------------------------------------------------------------------   PATIENT NAME: Bruce Bartlett MR#: [id number] DATE OF BIRTH: 13-Oct-1955 DATE OF ADMISSION: 05/18/2016 PRIMARY CARE PHYSICIAN: SPARKS,JEFFREY D, MD  REQUESTING/REFERRING PHYSICIAN: ED Dr. Lenard LancePaduchowski  CHIEF COMPLAINT: Chief Complaint  Patient presents with  . Dizziness  . Weakness    HISTORY OF PRESENT ILLNESS: Bruce Bartlett is a 60 y.o. male with a known history of Diabetes, hypertension, hyperlipidemia, CVA presents to the emergency department complaining of dizziness, weakness and vomiting..  Patient was in a usual state of health until 2 days ago when he reports several episodes of nausea, vomiting nonbloody nonbilious so seems crampy abdominal pain and diarrhea. He states that his by mouth intake has been decreased for the past 2 days secondary to his symptoms however he has developed generalized weakness and dizziness associated with his symptoms. He had transient episode of chest pain yesterday which went away and some mild chest pain today. He denies any fevers or chills.  Of note he has reported that he has had trouble controlling his blood sugars but there has been no change in diet or medications recently. His weight has also been stable..  Otherwise there has been no change in status. Patient has been taking medication as prescribed and there has been no recent change in medication or diet.  There has been no recent illness, travel or sick contacts.    Patient denies fevers/chills, shortness of breath, dysuria/frequency, changes in mental status.    PAST MEDICAL HISTORY: Past Medical History:  Diagnosis Date  . Diabetes mellitus without complication (HCC)   . Hyperlipidemia   . Hypertension   . Stroke [facility](HCC)       PAST SURGICAL HISTORY: Past Surgical  History:  Procedure Laterality Date  . BACK SURGERY        SOCIAL HISTORY: Social History  Substance Use Topics  . Smoking status: Former Games developermoker  . Smokeless tobacco: Never Used  . Alcohol use No      FAMILY HISTORY: History reviewed. No pertinent family history.   MEDICATIONS AT HOME: Prior to Admission medications   Medication Sig Start Date End Date Taking? Authorizing Provider  amLODipine (NORVASC) 10 MG tablet Take 1 tablet by mouth daily. 04/03/16  Yes Historical Provider, MD  atorvastatin (LIPITOR) 40 MG tablet Take 1 tablet by mouth daily. 04/03/16  Yes Historical Provider, MD  HUMALOG KWIKPEN 100 UNIT/ML KiwkPen Inject 10 Units into the skin 2 (two) times daily. 02/13/16  Yes Historical Provider, MD  hydrochlorothiazide (HYDRODIURIL) 25 MG tablet Take 25 mg by mouth daily. 04/03/16  Yes Historical Provider, MD  insulin NPH Human (HUMULIN N,NOVOLIN N) 100 UNIT/ML injection Inject 20 Units into the skin 2 (two) times daily before a meal.   Yes Historical Provider, MD  isosorbide mononitrate (IMDUR) 30 MG 24 hr tablet Take 1 tablet by mouth daily. 04/03/16  Yes Historical Provider, MD  latanoprost (XALATAN) 0.005 % ophthalmic solution Place 1 drop into both eyes at bedtime. 05/10/16  Yes Historical Provider, MD  losartan (COZAAR) 100 MG tablet Take 1 tablet by mouth daily. 04/03/16  Yes Historical Provider, MD  omega-3 acid ethyl esters (LOVAZA) 1 g capsule Take 1 g by mouth daily.   Yes Historical Provider, MD  vitamin B-12 (CYANOCOBALAMIN) 1000 MCG tablet Take 1,000 mcg by mouth daily.   Yes Historical Provider, MD      DRUG ALLERGIES: No Known Allergies   REVIEW OF SYSTEMS: CONSTITUTIONAL:  No fatigue, weakness, fever, chills, weight gain/loss, headache EYES: No blurry or double vision. ENT: No tinnitus, postnasal drip, redness or soreness of the oropharynx. RESPIRATORY: No dyspnea, cough, wheeze, hemoptysis. CARDIOVASCULAR: Positive chest pain, orthopnea, palpitations,  syncope. GASTROINTESTINAL: Positive nausea, vomiting,  diarrhea, abdominal pain. No constipation hematemesis, melena or hematochezia. GENITOURINARY: No dysuria, frequency, hematuria. ENDOCRINE: No polyuria or nocturia. No heat or cold intolerance. HEMATOLOGY: No anemia, bruising, bleeding. INTEGUMENTARY: No rashes, ulcers, lesions. MUSCULOSKELETAL: No pain, arthritis, swelling, gout. NEUROLOGIC: No numbness, tingling, weakness or ataxia. No seizure-type activity. PSYCHIATRIC: No anxiety, depression, insomnia.  PHYSICAL EXAMINATION: VITAL SIGNS: Blood pressure (!) 192/91, pulse 76, temperature 97.4 F (36.3 C), temperature source Oral, resp. rate 18, height 6\' 1"  (1.854 m), weight 106.5 kg (234 lb 11.2 oz), SpO2 99 %.  GENERAL: 60 y.o.-year-old black male patient, well-developed, well-nourished lying in the bed in no acute distress.  Pleasant and cooperative.   HEENT: Head atraumatic, normocephalic. Pupils equal, round, reactive to light and accommodation. No scleral icterus. Extraocular muscles intact.Mucus membranes moist. NECK: Supple, full range of motion. No JVD, no bruit heard. No cervical lymphadenopathy. CHEST: Normal breath sounds bilaterally. No wheezing, rales, rhonchi or crackles. No use of accessory muscles of respiration.  No reproducible chest wall tenderness.  CARDIOVASCULAR: S1, S2 normal. No murmurs, rubs, or gallops appreciated. Cap refill <2 seconds. ABDOMEN: Soft, nontender, nondistended. No rebound, guarding, rigidity. Normoactive bowel sounds present in all four quadrants. No organomegaly or mass. EXTREMITIES: Full range of motion. No pedal edema, cyanosis, or clubbing. NEUROLOGIC: Cranial nerves II through XII are grossly intact with no focal sensorimotor deficit. Muscle strength 5/5 in all extremities except right lower extremity which is 4-5. (Baseline). Sensation intact. Gait not checked. PSYCHIATRIC: The patient is alert and oriented x 3. Normal affect, mood, thought  content. SKIN: Warm, dry, and intact without obvious rash, lesion, or ulcer.  LABORATORY PANEL:  CBC  Recent Labs Lab 05/18/16 1806  WBC 10.7*  HGB 14.3  HCT 43.9  PLT 210   ----------------------------------------------------------------------------------------------------------------- Chemistries  Recent Labs Lab 05/18/16 1806  NA 137  K 3.8  CL 101  CO2 28  GLUCOSE 347*  BUN 30*  CREATININE 1.47*  CALCIUM 9.4   ------------------------------------------------------------------------------------------------------------------ Cardiac Enzymes  Recent Labs Lab 05/18/16 2133  TROPONINI 0.08*   ------------------------------------------------------------------------------------------------------------------  RADIOLOGY: Ct Head Wo Contrast  Result Date: 05/18/2016 CLINICAL DATA:  Right-sided weakness, chronic, worsening today EXAM: CT HEAD WITHOUT CONTRAST TECHNIQUE: Contiguous axial images were obtained from the base of the skull through the vertex without intravenous contrast. COMPARISON:  MRI brain dated 04/03/2016 FINDINGS: Brain: No evidence of acute infarction, hemorrhage, hydrocephalus, extra-axial collection or mass lesion/mass effect. Vascular: No hyperdense vessel or unexpected calcification. Skull: Normal. Negative for fracture or focal lesion. Sinuses/Orbits: The visualized paranasal sinuses are essentially clear. The mastoid air cells are unopacified. Other: Cerebral volume is within normal limits. No ventriculomegaly. Subcortical white matter and periventricular small vessel ischemic changes. IMPRESSION: No evidence of acute intracranial abnormality. Mild small vessel ischemic changes. Electronically Signed   By: Charline Bills M.D.   On: 05/18/2016 20:45    EKG: Normal sinus rhythm at 60 bpm with leftward axis, first-degree heart block and nonspecific ST-T wave changes.   IMPRESSION AND PLAN:  This is a 60 y.o. male with a history of  Diabetes,  hypertension, hyperlipidemia, CVA  now being admitted with: 1. Symptomatically bradycardia-patient has had several episodes of bradycardia in the 30s to mid 40s which has rebounded however because of  his symptoms of dizziness, lightheadedness, chest pain, EKG findings and recent illness we will admit to telemetry for monitoring. We will gently hydrate, trend troponins, obtain cardiology consultation, echo and carotids. Check check TSH and lipids. 2. NSTEMI-trend troponins, aspirin, statin ordered. Beta blocker contraindicated at this point given severe bradycardia. Given history and risk factors will start heparin.  3. AK I, unclear baseline creatinine-continue IV fluid hydration 4. Gastroenteritis - antiemetics, pain control, hydration.  5. History of diabetes-Accu-Cheks before meals and at bedtime insulin sliding skull coverage. 6. History of hypertension-continue Norvasc, hydrochlorothiazide, indoor and losartan 7. History of hyperlipidemia-continue Lipitor   Diet/Nutrition: Nothing by mouth after midnight for consideration of cardiac catheterization in a.m. Fluids: IV normal saline DVT Px: Lovenox, SCDs and early ambulation Code Status: Full  All the records are reviewed and case discussed with ED provider. Management plans discussed with the patient and/or family who express understanding and agree with plan of care.   TOTAL TIME TAKING CARE OF THIS PATIENT: 60 minutes.   Adriene Knipfer D.O. on 05/19/2016 at 1:39 AM Between 7am to 6pm - Pager - [phone] After 6pm go to [email or web address] - Social research officer, governmentpassword EPAS [facility] [place] [facility] Office [phone] CC: Primary care physician; Marguarite ArbourSPARKS,JEFFREY D, MD     Note: This dictation was prepared with Dragon dictation along with smaller phrase technology. Any transcriptional errors that result from this process are unintentional.

## 2016-05-19 NOTE — Progress Notes (Signed)
Patient's HR 30-40s. Central Tele to save a strip in chart. MD notified and aware. No new orders at this time.

## 2016-05-19 NOTE — Progress Notes (Signed)
Patients BP 181/68 upon recheck.HR currently 78. MD notified. MD to change frequency of hydralazine PO and ordered to give another 100mg  hydralazine PO stat. Patient is now hiccup free.

## 2016-05-20 LAB — GLUCOSE, CAPILLARY
GLUCOSE-CAPILLARY: 286 mg/dL — AB (ref 65–99)
Glucose-Capillary: 171 mg/dL — ABNORMAL HIGH (ref 65–99)
Glucose-Capillary: 387 mg/dL — ABNORMAL HIGH (ref 65–99)
Glucose-Capillary: 225 mg/dL — ABNORMAL HIGH (ref 65–99)

## 2016-05-20 MED ORDER — INSULIN NPH (HUMAN) (ISOPHANE) 100 UNIT/ML ~~LOC~~ SUSP: 15.0000 [IU] | Freq: Two times a day (BID) | SUBCUTANEOUS | Status: DC

## 2016-05-20 MED ORDER — INSULIN DETEMIR 100 UNIT/ML ~~LOC~~ SOLN
15.0000 [IU] | Freq: Two times a day (BID) | SUBCUTANEOUS | Status: DC
  Administered 2016-05-20 – 2016-05-21 (×2): 15 [IU] via SUBCUTANEOUS
  Filled 2016-05-20 (×3): qty 0.15

## 2016-05-20 MED ORDER — LACTULOSE 10 GM/15ML PO SOLN
30.0000 g | Freq: Once | ORAL | Status: AC
  Administered 2016-05-20: 30 g via ORAL
  Filled 2016-05-20: qty 60

## 2016-05-20 MED ORDER — HYDRALAZINE HCL 100 MG PO TABS: 100.0000 mg | ORAL_TABLET | Freq: Three times a day (TID) | ORAL | 0 refills | Status: DC

## 2016-05-20 MED ORDER — INSULIN ASPART 100 UNIT/ML ~~LOC~~ SOLN
0.0000 [IU] | Freq: Three times a day (TID) | SUBCUTANEOUS | Status: DC
  Administered 2016-05-20: 8 [IU] via SUBCUTANEOUS
  Administered 2016-05-21: 5 [IU] via SUBCUTANEOUS
  Administered 2016-05-21: 3 [IU] via SUBCUTANEOUS
  Filled 2016-05-20: qty 5
  Filled 2016-05-20: qty 8
  Filled 2016-05-20: qty 3

## 2016-05-20 MED ORDER — INSULIN ASPART 100 UNIT/ML ~~LOC~~ SOLN
0.0000 [IU] | Freq: Every day | SUBCUTANEOUS | Status: DC
  Administered 2016-05-20: 3 [IU] via SUBCUTANEOUS
  Filled 2016-05-20: qty 3

## 2016-05-20 NOTE — Progress Notes (Signed)
Pt. Slept well throughout the night with no c/o pain, SOB or acute distress noted. Wife at bedside. Blood pressure trended down. Midnight dose of hydralazine held, A.M. Hydralazine given, B/P starting to trend up.

## 2016-05-20 NOTE — Care Management (Signed)
Consult for medication needs.  Patient is receiving disability approximately 1400 dollars a month "but I have a lot of bills."  Says his copay for MD visits is 50 dollars a piece and he has 6 visits a month.  Discussed that Open Enrollment for Medicare is currently active and may be beneficial to look at other medicare plans.  Says he and his wife are looking at plans tomorrow.  Discussed that may need to pay a little higher premium to obtain a plan that better meets his needs.  Provided him written medicare resource to see if he would qualify for additional assistance on copays

## 2016-05-20 NOTE — Discharge Instructions (Addendum)
Resume activity and diet as before  Follow up with Dr. Judithann SheenSparks in 1 week for your blood pressure

## 2016-05-20 NOTE — Progress Notes (Signed)
Patient was with his wife in the room and they had a hope of going home tonight. Patient and his wife told chaplain that a nurse was going to check patient's blood pressure. If it was normal they could go home tonight. Chaplain prayed with patient and wife. A nurse came in to check patient's blood pressure. As chaplain was recording his visits, the spouse passed by and said that her husband's blood pressure was still high, which means he would not go home tonight.

## 2016-05-20 NOTE — Progress Notes (Signed)
Patient sbp 195/69 DR Elpidio AnisSudini was made aware  , PRN bp   Hydralazine was given , discharge on hold as per MD order will continue to monitor, wife at bedside

## 2016-05-20 NOTE — Consult Note (Signed)
Reason for Consult: Bradycardia vertigo hypertension Referring Physician: Dr. Georgie Chard primary, hospitalist Dr Ara Kussmaul  [name] is an 60 y.o. male.  HPI: Patient complains of diabetes hypertension hyperlipidemia CVA presents to emergency room with dizziness weakness with vomiting. Patient was found to have significant bradycardia. He also complained of nausea vomiting with minimal diarrhea. Patient states symptoms continued for several days he developed generalized weakness associated with symptoms transient vague chest pain yesterday and may have mild vague chest pain today. Patient had trouble controlling blood sugars as well as hypertension has reduced by mouth intake but now presents for further assessment and evaluation  Past Medical History:  Diagnosis Date  . Diabetes mellitus without complication ([place])   . Hyperlipidemia   . Hypertension   . Stroke [facility])     Past Surgical History:  Procedure Laterality Date  . BACK SURGERY      History reviewed. No pertinent family history.  Social History:  reports that he has quit smoking. He has never used smokeless tobacco. He reports that he does not drink alcohol. His drug history is not on file.  Allergies: No Known Allergies  Medications: I have reviewed the patient's current medications.  Results for orders placed or performed during the hospital encounter of 05/18/16 (from the past 48 hour(s))  Basic metabolic panel     Status: Abnormal   Collection Time: 05/18/16  6:06 PM  Result Value Ref Range   Sodium 137 135 - 145 mmol/L   Potassium 3.8 3.5 - 5.1 mmol/L   Chloride 101 101 - 111 mmol/L   CO2 28 22 - 32 mmol/L   Glucose, Bld 347 (H) 65 - 99 mg/dL   BUN 30 (H) 6 - 20 mg/dL   Creatinine, Ser 1.47 (H) 0.61 - 1.24 mg/dL   Calcium 9.4 8.9 - 10.3 mg/dL   GFR calc non Af Amer 50 (L) >60 mL/min   GFR calc Af Amer 58 (L) >60 mL/min    Comment: (NOTE) The eGFR has been calculated using the CKD EPI equation. This  calculation has not been validated in all clinical situations. eGFR's persistently <60 mL/min signify possible Chronic Kidney Disease.    Anion gap 8 5 - 15  CBC     Status: Abnormal   Collection Time: 05/18/16  6:06 PM  Result Value Ref Range   WBC 10.7 (H) 3.8 - 10.6 K/uL   RBC 5.91 (H) 4.40 - 5.90 MIL/uL   Hemoglobin 14.3 13.0 - 18.0 g/dL   HCT 43.9 40.0 - 52.0 %   MCV 74.2 (L) 80.0 - 100.0 fL   MCH 24.1 (L) 26.0 - 34.0 pg   MCHC 32.5 32.0 - 36.0 g/dL   RDW 16.6 (H) 11.5 - 14.5 %   Platelets 210 150 - 440 K/uL  Lipase, blood     Status: None   Collection Time: 05/18/16  6:06 PM  Result Value Ref Range   Lipase 17 11 - 51 U/L  Troponin I     Status: Abnormal   Collection Time: 05/18/16  6:06 PM  Result Value Ref Range   Troponin I 0.07 (HH) <0.03 ng/mL    Comment: CRITICAL RESULT CALLED TO, READ BACK BY AND VERIFIED WITH SANJA WEAVER ON 05/18/16 AT 2058 [facility]   Glucose, capillary     Status: Abnormal   Collection Time: 05/18/16  6:08 PM  Result Value Ref Range   Glucose-Capillary 347 (H) 65 - 99 mg/dL  Urinalysis complete, with microscopic  Status: Abnormal   Collection Time: 05/18/16  6:17 PM  Result Value Ref Range   Color, Urine YELLOW (A) YELLOW   APPearance HAZY (A) CLEAR   Glucose, UA >500 (A) NEGATIVE mg/dL   Bilirubin Urine NEGATIVE NEGATIVE   Ketones, ur NEGATIVE NEGATIVE mg/dL   Specific Gravity, Urine 1.025 1.005 - 1.030   Hgb urine dipstick 2+ (A) NEGATIVE   pH 5.0 5.0 - 8.0   Protein, ur >500 (A) NEGATIVE mg/dL   Nitrite NEGATIVE NEGATIVE   Leukocytes, UA NEGATIVE NEGATIVE   RBC / HPF 0-5 0 - 5 RBC/hpf   WBC, UA 6-30 0 - 5 WBC/hpf   Bacteria, UA NONE SEEN NONE SEEN   Squamous Epithelial / LPF 0-5 (A) NONE SEEN   Mucous PRESENT    Hyaline Casts, UA PRESENT   Glucose, capillary     Status: Abnormal   Collection Time: 05/18/16  9:20 PM  Result Value Ref Range   Glucose-Capillary 228 (H) 65 - 99 mg/dL  Troponin I     Status: Abnormal   Collection  Time: 05/18/16  9:33 PM  Result Value Ref Range   Troponin I 0.08 (HH) <0.03 ng/mL    Comment: CRITICAL VALUE NOTED. VALUE IS CONSISTENT WITH PREVIOUSLY REPORTED/CALLED VALUE [facility]   Troponin I     Status: Abnormal   Collection Time: 05/19/16  1:09 AM  Result Value Ref Range   Troponin I 0.08 (HH) <0.03 ng/mL    Comment: CRITICAL VALUE NOTED. VALUE IS CONSISTENT WITH PREVIOUSLY REPORTED/CALLED VALUE RWW   TSH     Status: None   Collection Time: 05/19/16  1:09 AM  Result Value Ref Range   TSH 1.150 0.350 - 4.500 uIU/mL    Comment: Performed by a 3rd Generation assay with a functional sensitivity of <=0.01 uIU/mL.  Phosphorus     Status: None   Collection Time: 05/19/16  1:09 AM  Result Value Ref Range   Phosphorus 3.6 2.5 - 4.6 mg/dL  Magnesium     Status: None   Collection Time: 05/19/16  1:09 AM  Result Value Ref Range   Magnesium 2.1 1.7 - 2.4 mg/dL  Glucose, capillary     Status: Abnormal   Collection Time: 05/19/16  2:25 AM  Result Value Ref Range   Glucose-Capillary 259 (H) 65 - 99 mg/dL   Comment 1 Notify RN    Comment 2 Document in Chart   Basic metabolic panel     Status: Abnormal   Collection Time: 05/19/16  2:35 AM  Result Value Ref Range   Sodium 137 135 - 145 mmol/L   Potassium 3.7 3.5 - 5.1 mmol/L   Chloride 103 101 - 111 mmol/L   CO2 26 22 - 32 mmol/L   Glucose, Bld 243 (H) 65 - 99 mg/dL   BUN 30 (H) 6 - 20 mg/dL   Creatinine, Ser 1.48 (H) 0.61 - 1.24 mg/dL   Calcium 8.9 8.9 - 10.3 mg/dL   GFR calc non Af Amer 49 (L) >60 mL/min   GFR calc Af Amer 57 (L) >60 mL/min    Comment: (NOTE) The eGFR has been calculated using the CKD EPI equation. This calculation has not been validated in all clinical situations. eGFR's persistently <60 mL/min signify possible Chronic Kidney Disease.    Anion gap 8 5 - 15  CBC     Status: Abnormal   Collection Time: 05/19/16  2:35 AM  Result Value Ref Range   WBC 10.4 3.8 - 10.6 K/uL  RBC 5.65 4.40 - 5.90 MIL/uL    Hemoglobin 14.1 13.0 - 18.0 g/dL   HCT 42.1 40.0 - 52.0 %   MCV 74.5 (L) 80.0 - 100.0 fL   MCH 24.9 (L) 26.0 - 34.0 pg   MCHC 33.4 32.0 - 36.0 g/dL   RDW 16.5 (H) 11.5 - 14.5 %   Platelets 221 150 - 440 K/uL  Lipid panel     Status: None   Collection Time: 05/19/16  2:35 AM  Result Value Ref Range   Cholesterol 147 0 - 200 mg/dL   Triglycerides 61 <150 mg/dL   HDL 42 >40 mg/dL   Total CHOL/HDL Ratio 3.5 RATIO   VLDL 12 0 - 40 mg/dL   LDL Cholesterol 93 0 - 99 mg/dL    Comment:        Total Cholesterol/HDL:CHD Risk Coronary Heart Disease Risk Table                     Men   Women  1/2 Average Risk   3.4   3.3  Average Risk       5.0   4.4  2 X Average Risk   9.6   7.1  3 X Average Risk  23.4   11.0        Use the calculated Patient Ratio above and the CHD Risk Table to determine the patient's CHD Risk.        ATP III CLASSIFICATION (LDL):  <100     mg/dL   Optimal  100-129  mg/dL   Near or Above                    Optimal  130-159  mg/dL   Borderline  160-189  mg/dL   High  >190     mg/dL   Very High   APTT     Status: None   Collection Time: 05/19/16  2:35 AM  Result Value Ref Range   aPTT 31 24 - 36 seconds  Protime-INR     Status: Abnormal   Collection Time: 05/19/16  2:35 AM  Result Value Ref Range   Prothrombin Time 15.6 (H) 11.4 - 15.2 seconds   INR 1.23   Glucose, capillary     Status: Abnormal   Collection Time: 05/19/16  5:43 AM  Result Value Ref Range   Glucose-Capillary 167 (H) 65 - 99 mg/dL   Comment 1 Notify RN    Comment 2 Document in Chart   Troponin I     Status: Abnormal   Collection Time: 05/19/16  6:58 AM  Result Value Ref Range   Troponin I 0.08 (HH) <0.03 ng/mL    Comment: CRITICAL VALUE NOTED. VALUE IS CONSISTENT WITH PREVIOUSLY REPORTED/CALLED VALUE [facility]   Glucose, capillary     Status: Abnormal   Collection Time: 05/19/16  7:47 AM  Result Value Ref Range   Glucose-Capillary 155 (H) 65 - 99 mg/dL  Heparin level (unfractionated)      Status: Abnormal   Collection Time: 05/19/16  9:51 AM  Result Value Ref Range   Heparin Unfractionated 0.80 (H) 0.30 - 0.70 IU/mL    Comment:        IF HEPARIN RESULTS ARE BELOW EXPECTED VALUES, AND PATIENT DOSAGE HAS BEEN CONFIRMED, SUGGEST FOLLOW UP TESTING OF ANTITHROMBIN III LEVELS.   Glucose, capillary     Status: Abnormal   Collection Time: 05/19/16 11:30 AM  Result Value Ref Range   Glucose-Capillary 180 (H)  65 - 99 mg/dL  Troponin I     Status: Abnormal   Collection Time: 05/19/16  1:11 PM  Result Value Ref Range   Troponin I 0.05 (HH) <0.03 ng/mL    Comment: CRITICAL VALUE NOTED. VALUE IS CONSISTENT WITH PREVIOUSLY REPORTED/CALLED VALUE [facility]   Glucose, capillary     Status: Abnormal   Collection Time: 05/19/16  5:18 PM  Result Value Ref Range   Glucose-Capillary 269 (H) 65 - 99 mg/dL   Comment 1 Notify RN    Comment 2 Document in Chart   Glucose, capillary     Status: Abnormal   Collection Time: 05/19/16  9:10 PM  Result Value Ref Range   Glucose-Capillary 248 (H) 65 - 99 mg/dL  Glucose, capillary     Status: Abnormal   Collection Time: 05/19/16 11:52 PM  Result Value Ref Range   Glucose-Capillary 227 (H) 65 - 99 mg/dL  Glucose, capillary     Status: Abnormal   Collection Time: 05/20/16  5:13 AM  Result Value Ref Range   Glucose-Capillary 171 (H) 65 - 99 mg/dL    Ct Head Wo Contrast  Result Date: 05/18/2016 CLINICAL DATA:  Right-sided weakness, chronic, worsening today EXAM: CT HEAD WITHOUT CONTRAST TECHNIQUE: Contiguous axial images were obtained from the base of the skull through the vertex without intravenous contrast. COMPARISON:  MRI brain dated 04/03/2016 FINDINGS: Brain: No evidence of acute infarction, hemorrhage, hydrocephalus, extra-axial collection or mass lesion/mass effect. Vascular: No hyperdense vessel or unexpected calcification. Skull: Normal. Negative for fracture or focal lesion. Sinuses/Orbits: The visualized paranasal sinuses are essentially  clear. The mastoid air cells are unopacified. Other: Cerebral volume is within normal limits. No ventriculomegaly. Subcortical white matter and periventricular small vessel ischemic changes. IMPRESSION: No evidence of acute intracranial abnormality. Mild small vessel ischemic changes. Electronically Signed   By: Julian Hy M.D.   On: 05/18/2016 20:45   US Carotid Bilateral  Result Date: 05/19/2016 CLINICAL DATA:  Dizziness for 3 days EXAM: BILATERAL CAROTID DUPLEX ULTRASOUND TECHNIQUE: Pearline Cables scale imaging, color Doppler and duplex ultrasound were performed of bilateral carotid and vertebral arteries in the neck. COMPARISON:  None. FINDINGS: Criteria: Quantification of carotid stenosis is based on velocity parameters that correlate the residual internal carotid diameter with NASCET-based stenosis levels, using the diameter of the distal internal carotid lumen as the denominator for stenosis measurement. The following velocity measurements were obtained: RIGHT ICA:  57 cm/sec CCA:  98 cm/sec SYSTOLIC ICA/CCA RATIO:  0.6 DIASTOLIC ICA/CCA RATIO:  0.7 ECA:  179 cm/sec LEFT ICA:  110 cm/sec CCA:  466 cm/sec SYSTOLIC ICA/CCA RATIO:  0.9 DIASTOLIC ICA/CCA RATIO:  1.0 ECA:  169 cm/sec RIGHT CAROTID ARTERY: Mild scattered calcified plaque in the common carotid. Minimal smooth plaque in the bulb. Low resistance internal carotid Doppler pattern. RIGHT VERTEBRAL ARTERY:  Antegrade LEFT CAROTID ARTERY: There is smooth intimal thickening along the mid common carotid. There is also smooth plaque in the bulb. Low resistance internal carotid Doppler pattern is preserved. LEFT VERTEBRAL ARTERY:  Antegrade. IMPRESSION: Less than 50% stenosis in the right and left internal carotid arteries. Electronically Signed   By: Marybelle Killings M.D.   On: 05/19/2016 10:24   Dg Abd 2 Views  Result Date: 05/19/2016 CLINICAL DATA:  Vomiting EXAM: ABDOMEN - 2 VIEW COMPARISON:  06/30/2005 CT abdomen/pelvis FINDINGS: Bilateral posterior  spinal fusion hardware seen in the lower lumbar spine. No dilated small bowel loops or air-fluid levels. Mild-to-moderate colonic stool volume. No evidence of pneumatosis  or pneumoperitoneum. Clear lung bases. No pathologic soft tissue calcifications. IMPRESSION: Nonobstructive bowel gas pattern. Electronically Signed   By: Ilona Sorrel M.D.   On: 05/19/2016 08:59    Review of Systems  Constitutional: Positive for diaphoresis and malaise/fatigue.  HENT: Positive for congestion.   Eyes: Negative.   Respiratory: Negative.   Gastrointestinal: Positive for diarrhea, nausea and vomiting.  Genitourinary: Negative.   Musculoskeletal: Positive for back pain.  Skin: Negative.   Neurological: Positive for dizziness, tingling and weakness.  Endo/Heme/Allergies: Negative.   Psychiatric/Behavioral: Negative.    Blood pressure (!) 199/73, pulse 70, temperature 98.1 F (36.7 C), temperature source Oral, resp. rate 18, height 6' 1"  (1.854 m), weight 106.5 kg (234 lb 11.2 oz), SpO2 99 %. Physical Exam  Nursing note and vitals reviewed. Constitutional: He is oriented to person, place, and time. He appears well-developed and well-nourished.  HENT:  Head: Normocephalic and atraumatic.  Eyes: Conjunctivae and EOM are normal. Pupils are equal, round, and reactive to light.  Neck: Normal range of motion. Neck supple.  Cardiovascular: Normal rate and regular rhythm.   Respiratory: Effort normal and breath sounds normal.  GI: Soft. Bowel sounds are normal.  Musculoskeletal: Normal range of motion.  Neurological: He is alert and oriented to person, place, and time. He displays abnormal reflex. He exhibits abnormal muscle tone. Coordination abnormal.  Skin: Skin is warm and dry.  Psychiatric: He has a normal mood and affect.    Assessment/Plan: Bradycardia Vertigo Dizziness History of CVA Gastroenteritis Hypertension Diabetes Right-sided residual hemiparesis Hyperlipidemia Elevated  troponin . PLAN Agree with admission for rule out myocardial infarction Continue telemetry for bradycardia No clear indication for permanent pacemaker at this point Continue diabetes management and control Agree with hypertension management with Norvasc and Cozaar avoid beta blockers or Cardizem Continue Imdur therapy for possible angina Consider neurology evaluation for vertigo and dizziness Have the patient follow-up with nephrology for renal insufficiency Consider functional study Recommend consider GI evaluation for acute gastroenteritis consider omeprazole Zantac hydration Borderline troponins probably demand ischemia CALLWOOD,DWAYNE D. 05/20/2016, 9:26 AM

## 2016-05-20 NOTE — Progress Notes (Signed)
Inpatient Diabetes Program Recommendations  AACE/ADA: New Consensus Statement on Inpatient Glycemic Control (2015)  Target Ranges:  Prepandial:   less than 140 mg/dL      Peak postprandial:   less than 180 mg/dL (1-2 hours)      Critically ill patients:  140 - 180 mg/dL   Lab Results  Component Value Date   GLUCAP 171 (H) 05/20/2016    Review of Glycemic Control: Results for Bruce Bartlett, Exavier L (MRN [id number]) as of 05/20/2016 11:18  Ref. Range 05/19/2016 11:30 05/19/2016 17:18 05/19/2016 21:10 05/19/2016 23:52 05/20/2016 05:13  Glucose-Capillary Latest Ref Range: 65 - 99 mg/dL 409180 (H) 811269 (H) 914248 (H) 227 (H) 171 (H)     Diabetes history: Type 2 diabetes Outpatient Diabetes medications: NPH 20 units bid, Novolog 10 units bid Current orders for Inpatient glycemic control:  Novolog sensitive q 6 hours  Inpatient Diabetes Program Recommendations:   Consider changing Novolog correction to tid with meals and HS.  Also consider adding NPH 10 units bid (1/2 of home dose).  Thanks, Beryl MeagerJenny Tavita Eastham, RN, BC-ADM Inpatient Diabetes Coordinator Pager [phone] (8a-5p)

## 2016-05-20 NOTE — Progress Notes (Signed)
B/P 106/43 midnight hydralazine held, Dr. Emmit PomfretHugelmeyer aware. Will continue to monitor pt.

## 2016-05-21 LAB — GLUCOSE, CAPILLARY
GLUCOSE-CAPILLARY: 153 mg/dL — AB (ref 65–99)
GLUCOSE-CAPILLARY: 172 mg/dL — AB (ref 65–99)
Glucose-Capillary: 172 mg/dL — ABNORMAL HIGH (ref 65–99)

## 2016-05-21 MED ORDER — METOCLOPRAMIDE HCL 5 MG PO TABS: 5.0000 mg | ORAL_TABLET | Freq: Three times a day (TID) | ORAL | 0 refills | Status: DC | PRN

## 2016-05-21 NOTE — Progress Notes (Signed)
Pt. Discharged to home via wc. Discharge instructions and medication regimen reviewed at bedside with patient and spouse. Both verbalizes understanding of instructions and medication regimen. Prescriptions sent to pharmacy. Patient assessment unchanged from this morning. TELE and IV discontinued per policy.

## 2016-05-21 NOTE — Progress Notes (Signed)
Per nurse tech, 1200 CBG: 241. Glucometers not flowing over into EPIC results at this time.

## 2016-05-21 NOTE — Progress Notes (Signed)
[employer] - [place] [facility]   PATIENT NAME: Bruce Bartlett    MR#:  [id number]  DATE OF BIRTH:  04-28-1956  SUBJECTIVE:  CHIEF COMPLAINT:   Chief Complaint  Patient presents with  . Dizziness  . Weakness   Admitted for dizziness and vomiting. Now symptoms resolved. Constipated BP still high  REVIEW OF SYSTEMS:    Review of Systems  Constitutional: Positive for malaise/fatigue. Negative for chills and fever.  HENT: Negative for sore throat.   Eyes: Negative for blurred vision, double vision and pain.  Respiratory: Negative for cough, hemoptysis, shortness of breath and wheezing.   Cardiovascular: Negative for chest pain, palpitations, orthopnea and leg swelling.  Gastrointestinal: Negative for abdominal pain, constipation, diarrhea, heartburn, nausea and vomiting.  Genitourinary: Negative for dysuria and hematuria.  Musculoskeletal: Negative for back pain and joint pain.  Skin: Negative for rash.  Neurological: Negative for sensory change, speech change, focal weakness and headaches.  Endo/Heme/Allergies: Does not bruise/bleed easily.  Psychiatric/Behavioral: Negative for depression. The patient is not nervous/anxious.     DRUG ALLERGIES:  No Known Allergies  VITALS:  Blood pressure 137/64, pulse 75, temperature 98.5 F (36.9 C), temperature source Oral, resp. rate 20, height 6\' 1"  (1.854 m), weight 106.5 kg (234 lb 11.2 oz), SpO2 97 %.  PHYSICAL EXAMINATION:   Physical Exam  GENERAL:  60 y.o.-year-old patient lying in the bed with no acute distress.  EYES: Pupils equal, round, reactive to light and accommodation. No scleral icterus. Extraocular muscles intact.  HEENT: Head atraumatic, normocephalic. Oropharynx and nasopharynx clear.  NECK:  Supple, no jugular venous distention. No thyroid enlargement, no tenderness.  LUNGS: Normal breath sounds bilaterally, no wheezing, rales, rhonchi. No use of accessory muscles of respiration.   CARDIOVASCULAR: S1, S2 normal. No murmurs, rubs, or gallops.  ABDOMEN: Soft, nontender, nondistended. Bowel sounds present. No organomegaly or mass.  EXTREMITIES: No cyanosis, clubbing or edema b/l.    NEUROLOGIC: Cranial nerves II through XII are intact. No focal Motor or sensory deficits b/l.   PSYCHIATRIC: The patient is alert and oriented x 3.  SKIN: No obvious rash, lesion, or ulcer.   LABORATORY PANEL:   CBC  Recent Labs Lab 05/19/16 0235  WBC 10.4  HGB 14.1  HCT 42.1  PLT 221   ------------------------------------------------------------------------------------------------------------------ Chemistries   Recent Labs Lab 05/19/16 0109 05/19/16 0235  NA  --  137  K  --  3.7  CL  --  103  CO2  --  26  GLUCOSE  --  243*  BUN  --  30*  CREATININE  --  1.48*  CALCIUM  --  8.9  MG 2.1  --    ------------------------------------------------------------------------------------------------------------------  Cardiac Enzymes  Recent Labs Lab 05/19/16 1311  TROPONINI 0.05*   ------------------------------------------------------------------------------------------------------------------  RADIOLOGY:  [place] Carotid Bilateral  Result Date: 05/19/2016 CLINICAL DATA:  Dizziness for 3 days EXAM: BILATERAL CAROTID DUPLEX ULTRASOUND TECHNIQUE: Wallace CullensGray scale imaging, color Doppler and duplex ultrasound were performed of bilateral carotid and vertebral arteries in the neck. COMPARISON:  None. FINDINGS: Criteria: Quantification of carotid stenosis is based on velocity parameters that correlate the residual internal carotid diameter with NASCET-based stenosis levels, using the diameter of the distal internal carotid lumen as the denominator for stenosis measurement. The following velocity measurements were obtained: RIGHT ICA:  57 cm/sec CCA:  98 cm/sec SYSTOLIC ICA/CCA RATIO:  0.6 DIASTOLIC ICA/CCA RATIO:  0.7 ECA:  179 cm/sec LEFT ICA:  110 cm/sec CCA:  125 cm/sec SYSTOLIC ICA/CCA  RATIO:  0.9 DIASTOLIC ICA/CCA RATIO:  1.0 ECA:  169 cm/sec RIGHT CAROTID ARTERY: Mild scattered calcified plaque in the common carotid. Minimal smooth plaque in the bulb. Low resistance internal carotid Doppler pattern. RIGHT VERTEBRAL ARTERY:  Antegrade LEFT CAROTID ARTERY: There is smooth intimal thickening along the mid common carotid. There is also smooth plaque in the bulb. Low resistance internal carotid Doppler pattern is preserved. LEFT VERTEBRAL ARTERY:  Antegrade. IMPRESSION: Less than 50% stenosis in the right and left internal carotid arteries. Electronically Signed   By: Jolaine ClickArthur  Hoss M.D.   On: 05/19/2016 10:24   Dg Abd 2 Views  Result Date: 05/19/2016 CLINICAL DATA:  Vomiting EXAM: ABDOMEN - 2 VIEW COMPARISON:  06/30/2005 CT abdomen/pelvis FINDINGS: Bilateral posterior spinal fusion hardware seen in the lower lumbar spine. No dilated small bowel loops or air-fluid levels. Mild-to-moderate colonic stool volume. No evidence of pneumatosis or pneumoperitoneum. Clear lung bases. No pathologic soft tissue calcifications. IMPRESSION: Nonobstructive bowel gas pattern. Electronically Signed   By: Delbert PhenixJason A Poff M.D.   On: 05/19/2016 08:59     ASSESSMENT AND PLAN:   * malignant HTN Restarted home meds. Add hydralazine 100mg  TID MONitor overnight as BP is still elevated  * IDDM Home meds and SSI  * Viral gastroenteritis Resolved  All the records are reviewed and case discussed with Care Management/Social Workerr. Management plans discussed with the patient, family and they are in agreement.  CODE STATUS: FULL CODE  DVT Prophylaxis: SCDs  TOTAL TIME TAKING CARE OF THIS PATIENT: 35 minutes.   POSSIBLE D/C IN 1-2 DAYS, DEPENDING ON CLINICAL CONDITION.  Milagros LollSudini, Tynan Boesel R M.D on 05/21/2016 at 7:59 AM  Between 7am to 6pm - Pager - [phone]  After 6pm go to [email or web address] - password [email or web address] [facility]  [facility]  Office  [phone]  CC: Primary care physician;  Marguarite ArbourSPARKS,JEFFREY D, MD  Note: This dictation was prepared with Dragon dictation along with smaller phrase technology. Any transcriptional errors that result from this process are unintentional.

## 2016-05-21 NOTE — Progress Notes (Signed)
Inpatient Diabetes Program Recommendations  AACE/ADA: New Consensus Statement on Inpatient Glycemic Control (2015)  Target Ranges:  Prepandial:   less than 140 mg/dL      Peak postprandial:   less than 180 mg/dL (1-2 hours)      Critically ill patients:  140 - 180 mg/dL   Lab Results  Component Value Date   GLUCAP 172 (H) 05/21/2016    Review of Glycemic Control  Results for Bruce Bartlett, Damoni L (MRN [id number]) as of 05/21/2016 11:08  Ref. Range 05/20/2016 17:05 05/20/2016 20:09 05/20/2016 23:58 05/21/2016 06:00 05/21/2016 08:03  Glucose-Capillary Latest Ref Range: 65 - 99 mg/dL 782286 (H) 956225 (H) 213153 (H) 172 (H) 172 (H)   Diabetes history: Type 2 diabetes Outpatient Diabetes medications: NPH 20 units bid, Novolog 10 units bid  Current orders for Inpatient glycemic control: Novolog sensitive correction tid, Novolog 0-5 units qhs, Levemir 15 units bid  Inpatient Diabetes Program Recommendations:  Agree with current medications for blood sugar management.    Susette RacerJulie Lizzett Nobile, RN, BA, MHA, CDE Diabetes Coordinator Inpatient Diabetes Program  [phone] (Team Pager) [phone] [facility] Office) 05/21/2016 11:09 AM

## 2016-05-21 NOTE — Progress Notes (Signed)
Pt. Slept throughout the night with no signs or c/o pain, SOB or acute distress noted. Wife continues at bed side

## 2016-05-22 LAB — GLUCOSE, CAPILLARY: Glucose-Capillary: 241 mg/dL — ABNORMAL HIGH (ref 65–99)

## 2016-05-23 NOTE — Discharge Summary (Signed)
[employer] [facility] [place] [facility]   PATIENT NAME: Bruce Bartlett    MR#:  [id number]  DATE OF BIRTH:  1955-11-18  DATE OF ADMISSION:  05/18/2016 ADMITTING PHYSICIAN: Tonye RoyaltyAlexis Hugelmeyer, DO  DATE OF DISCHARGE: 05/21/2016  1:40 PM  PRIMARY CARE PHYSICIAN: SPARKS,JEFFREY D, MD   ADMISSION DIAGNOSIS:  Dizziness [R42] Symptomatic bradycardia [R00.1]  DISCHARGE DIAGNOSIS:  Active Problems:   Symptomatic bradycardia   SECONDARY DIAGNOSIS:   Past Medical History:  Diagnosis Date  . Diabetes mellitus without complication (HCC)   . Hyperlipidemia   . Hypertension   . Stroke [facility](HCC)      ADMITTING HISTORY  HISTORY OF PRESENT ILLNESS: Bruce Bartlett is a 60 y.o. male with a known history of Diabetes, hypertension, hyperlipidemia, CVA presents to the emergency department complaining of dizziness, weakness and vomiting..  Patient was in a usual state of health until 2 days ago when he reports several episodes of nausea, vomiting nonbloody nonbilious so seems crampy abdominal pain and diarrhea. He states that his by mouth intake has been decreased for the past 2 days secondary to his symptoms however he has developed generalized weakness and dizziness associated with his symptoms. He had transient episode of chest pain yesterday which went away and some mild chest pain today. He denies any fevers or chills.  Of note he has reported that he has had trouble controlling his blood sugars but there has been no change in diet or medications recently. His weight has also been stable..  Otherwise there has been no change in status. Patient has been taking medication as prescribed and there has been no recent change in medication or diet.  There has been no recent illness, travel or sick contacts.    Patient denies fevers/chills, shortness of breath, dysuria/frequency, changes in mental status.    HOSPITAL COURSE:   Patient was admitted onto telemetry floor. His  gastroenteritis responded well to some dramatic management. Dehydration resolved.  * malignant HTN - as patient was unable to keep medications down from vomiting. Restarted home meds. Add hydralazine 100mg  TID Blood pressure in normal range. He has follow-up with his primary care physician in one week.  * IDDM Home meds and SSI  * Viral gastroenteritis Resolved  Stable for discharge home. Prescription sent to pharmacy.  CONSULTS OBTAINED:  Treatment Team:  Alwyn Peawayne D Callwood, MD  DRUG ALLERGIES:  No Known Allergies  DISCHARGE MEDICATIONS:   Discharge Medication List as of 05/21/2016 12:28 PM    START taking these medications   Details  hydrALAZINE (APRESOLINE) 100 MG tablet Take 1 tablet (100 mg total) by mouth 3 (three) times daily., Starting Mon 05/20/2016, Normal    metoCLOPramide (REGLAN) 5 MG tablet Take 1 tablet (5 mg total) by mouth every 8 (eight) hours as needed for nausea or vomiting., Starting Tue 05/21/2016, Normal      CONTINUE these medications which have NOT CHANGED   Details  amLODipine (NORVASC) 10 MG tablet Take 1 tablet by mouth daily., Starting Wed 04/03/2016, Historical Med    atorvastatin (LIPITOR) 40 MG tablet Take 1 tablet by mouth daily., Starting Wed 04/03/2016, Historical Med    HUMALOG KWIKPEN 100 UNIT/ML KiwkPen Inject 10 Units into the skin 2 (two) times daily., Starting Tue 02/13/2016, Historical Med    hydrochlorothiazide (HYDRODIURIL) 25 MG tablet Take 25 mg by mouth daily., Starting Wed 04/03/2016, Historical Med    insulin NPH Human (HUMULIN N,NOVOLIN N) 100 UNIT/ML injection Inject 20 Units into the skin 2 (two)  times daily before a meal., Historical Med    isosorbide mononitrate (IMDUR) 30 MG 24 hr tablet Take 1 tablet by mouth daily., Starting Wed 04/03/2016, Historical Med    latanoprost (XALATAN) 0.005 % ophthalmic solution Place 1 drop into both eyes at bedtime., Starting Fri 05/10/2016, Historical Med    losartan (COZAAR) 100 MG  tablet Take 1 tablet by mouth daily., Starting Wed 04/03/2016, Historical Med    omega-3 acid ethyl esters (LOVAZA) 1 g capsule Take 1 g by mouth daily., Historical Med    vitamin B-12 (CYANOCOBALAMIN) 1000 MCG tablet Take 1,000 mcg by mouth daily., Historical Med        Today   VITAL SIGNS:  Blood pressure (!) 156/74, pulse 69, temperature 98.5 F (36.9 C), temperature source Oral, resp. rate 20, height 6\' 1"  (1.854 m), weight 106.5 kg (234 lb 11.2 oz), SpO2 97 %.  I/O:  No intake or output data in the 24 hours ending 05/23/16 1555  PHYSICAL EXAMINATION:  Physical Exam  GENERAL:  60 y.o.-year-old patient lying in the bed with no acute distress.  LUNGS: Normal breath sounds bilaterally, no wheezing, rales,rhonchi or crepitation. No use of accessory muscles of respiration.  CARDIOVASCULAR: S1, S2 normal. No murmurs, rubs, or gallops.  ABDOMEN: Soft, non-tender, non-distended. Bowel sounds present. No organomegaly or mass.  NEUROLOGIC: Moves all 4 extremities. PSYCHIATRIC: The patient is alert and oriented x 3.  SKIN: No obvious rash, lesion, or ulcer.   DATA REVIEW:   CBC  Recent Labs Lab 05/19/16 0235  WBC 10.4  HGB 14.1  HCT 42.1  PLT 221    Chemistries   Recent Labs Lab 05/19/16 0109 05/19/16 0235  NA  --  137  K  --  3.7  CL  --  103  CO2  --  26  GLUCOSE  --  243*  BUN  --  30*  CREATININE  --  1.48*  CALCIUM  --  8.9  MG 2.1  --     Cardiac Enzymes  Recent Labs Lab 05/19/16 1311  TROPONINI 0.05*    Microbiology Results  No results found for this or any previous visit.  RADIOLOGY:  No results found.  Follow up with PCP in 1 week.  Management plans discussed with the patient, family and they are in agreement.  CODE STATUS:  Code Status History    Date Active Date Inactive Code Status Order ID Comments User Context   05/19/2016 12:41 AM 05/21/2016  4:45 PM Full Code [id number]  Tonye RoyaltyAlexis Hugelmeyer, DO Inpatient      TOTAL TIME TAKING  CARE OF THIS PATIENT ON DAY OF DISCHARGE: more than 30 minutes.   Milagros LollSudini, Alishba [place] R M.D on 05/23/2016 at 3:55 PM  Between 7am to 6pm - Pager - [phone]  After 6pm go to [email or web address] - password [email or web address] [facility]  [facility] [place] [facility]  Office  [phone]  CC: Primary care physician; Marguarite ArbourSPARKS,JEFFREY D, MD  Note: This dictation was prepared with Dragon dictation along with smaller phrase technology. Any transcriptional errors that result from this process are unintentional.

## 2016-08-12 ENCOUNTER — Encounter (INDEPENDENT_AMBULATORY_CARE_PROVIDER_SITE_OTHER): Payer: Medicare Other

## 2016-08-12 ENCOUNTER — Ambulatory Visit (INDEPENDENT_AMBULATORY_CARE_PROVIDER_SITE_OTHER): Payer: Self-pay | Admitting: Vascular Surgery

## 2016-09-12 ENCOUNTER — Other Ambulatory Visit (INDEPENDENT_AMBULATORY_CARE_PROVIDER_SITE_OTHER): Payer: Medicare Other

## 2016-09-12 ENCOUNTER — Other Ambulatory Visit (INDEPENDENT_AMBULATORY_CARE_PROVIDER_SITE_OTHER): Payer: Self-pay | Admitting: Vascular Surgery

## 2016-09-12 DIAGNOSIS — I739 Peripheral vascular disease, unspecified: Secondary | ICD-10-CM | POA: Diagnosis not present

## 2016-09-16 ENCOUNTER — Encounter (INDEPENDENT_AMBULATORY_CARE_PROVIDER_SITE_OTHER): Payer: Self-pay | Admitting: Vascular Surgery

## 2016-09-23 ENCOUNTER — Encounter (INDEPENDENT_AMBULATORY_CARE_PROVIDER_SITE_OTHER): Payer: Medicare Other

## 2016-09-23 ENCOUNTER — Ambulatory Visit (INDEPENDENT_AMBULATORY_CARE_PROVIDER_SITE_OTHER): Payer: Self-pay | Admitting: Vascular Surgery

## 2016-10-29 ENCOUNTER — Ambulatory Visit
Admission: RE | Admit: 2016-10-29 | Discharge: 2016-10-29 | Disposition: A | Discharge status: Home / Self Care | Payer: Medicare Other | Source: Ambulatory Visit | Attending: Cardiology | Admitting: Cardiology

## 2016-10-29 ENCOUNTER — Ambulatory Visit: Admission: RE | Admit: 2016-10-29 | Payer: Medicare Other | Source: Ambulatory Visit

## 2016-10-29 ENCOUNTER — Encounter
Admission: RE | Admit: 2016-10-29 | Discharge: 2016-10-29 | Disposition: A | Discharge status: Home / Self Care | Payer: Medicare Other | Source: Ambulatory Visit | Attending: Cardiology | Admitting: Cardiology

## 2016-10-29 DIAGNOSIS — Z01818 Encounter for other preprocedural examination: Secondary | ICD-10-CM | POA: Diagnosis not present

## 2016-10-29 DIAGNOSIS — Z01812 Encounter for preprocedural laboratory examination: Secondary | ICD-10-CM | POA: Diagnosis present

## 2016-10-29 DIAGNOSIS — I517 Cardiomegaly: Secondary | ICD-10-CM | POA: Insufficient documentation

## 2016-10-29 DIAGNOSIS — Z01810 Encounter for preprocedural cardiovascular examination: Secondary | ICD-10-CM | POA: Insufficient documentation

## 2016-10-29 HISTORY — DX: Peripheral vascular disease, unspecified: I73.9

## 2016-10-29 HISTORY — DX: Dyspnea, unspecified: R06.00

## 2016-10-29 HISTORY — DX: Cardiomyopathy, unspecified: I42.9

## 2016-10-29 HISTORY — DX: Chronic kidney disease, unspecified: N18.9

## 2016-10-29 HISTORY — DX: Chronic kidney disease, stage 3 (moderate): N18.3

## 2016-10-29 HISTORY — DX: Hypothyroidism, unspecified: E03.9

## 2016-10-29 HISTORY — DX: Chronic kidney disease, stage 3 unspecified: N18.30

## 2016-10-29 LAB — DIFFERENTIAL
BASOS ABS: 0 10*3/uL (ref 0–0.1)
BASOS PCT: 0 %
Eosinophils Absolute: 0 10*3/uL (ref 0–0.7)
Eosinophils Relative: 1 %
LYMPHS PCT: 18 %
Lymphs Abs: 1.2 10*3/uL (ref 1.0–3.6)
MONO ABS: 0.7 10*3/uL (ref 0.2–1.0)
Monocytes Relative: 10 %
NEUTROS ABS: 4.8 10*3/uL (ref 1.4–6.5)
NEUTROS PCT: 71 %

## 2016-10-29 LAB — BASIC METABOLIC PANEL
ANION GAP: 4 — AB (ref 5–15)
BUN: 24 mg/dL — ABNORMAL HIGH (ref 6–20)
CHLORIDE: 106 mmol/L (ref 101–111)
CO2: 31 mmol/L (ref 22–32)
Calcium: 9.2 mg/dL (ref 8.9–10.3)
Creatinine, Ser: 1.53 mg/dL — ABNORMAL HIGH (ref 0.61–1.24)
GFR, EST AFRICAN AMERICAN: 55 mL/min — AB (ref >60)
GFR, EST NON AFRICAN AMERICAN: 47 mL/min — AB (ref >60)
Glucose, Bld: 70 mg/dL (ref 65–99)
POTASSIUM: 4.1 mmol/L (ref 3.5–5.1)
SODIUM: 141 mmol/L (ref 135–145)

## 2016-10-29 LAB — CBC
HCT: 40.5 % (ref 40.0–52.0)
Hemoglobin: 12.7 g/dL — ABNORMAL LOW (ref 13.0–18.0)
MCH: 23 pg — ABNORMAL LOW (ref 26.0–34.0)
MCHC: 31.4 g/dL — ABNORMAL LOW (ref 32.0–36.0)
MCV: 73.3 fL — AB (ref 80.0–100.0)
PLATELETS: 199 10*3/uL (ref 150–440)
RBC: 5.52 MIL/uL (ref 4.40–5.90)
RDW: 16.6 % — ABNORMAL HIGH (ref 11.5–14.5)
WBC: 6.8 10*3/uL (ref 3.8–10.6)

## 2016-10-29 LAB — SURGICAL PCR SCREEN
MRSA, PCR: NEGATIVE
Staphylococcus aureus: NEGATIVE

## 2016-10-29 LAB — PROTIME-INR
INR: 1.24
PROTHROMBIN TIME: 15.7 s — AB (ref 11.4–15.2)

## 2016-10-29 LAB — APTT: APTT: 32 s (ref 24–36)

## 2016-10-29 NOTE — Patient Instructions (Signed)
Your procedure is scheduled on: Thursday 10/31/16 Report to DAY SURGERY. 2ND FLOOR MEDICAL MALL ENTRANCE. To find out your arrival time please call [phone] between 1PM - 3PM on Wednesday 10/30/16.  Remember: Instructions that are not followed completely may result in serious medical risk, up to and including death, or upon the discretion of your surgeon and anesthesiologist your surgery may need to be rescheduled.    __X__ 1. Do not eat food or drink liquids after midnight. No gum chewing or hard candies.     __X__ 2. No Alcohol for 24 hours before or after surgery.   ____ 3. Bring all medications with you on the day of surgery if instructed.    __X__ 4. Notify your doctor if there is any change in your medical condition     (cold, fever, infections).             ___X__5. No smoking within 24 hours of your surgery.     Do not wear jewelry, make-up, hairpins, clips or nail polish.  Do not wear lotions, powders, or perfumes.   Do not shave 48 hours prior to surgery. Men may shave face and neck.  Do not bring valuables to the hospital.    [facility] is not responsible for any belongings or valuables.               Contacts, dentures or bridgework may not be worn into surgery.  Leave your suitcase in the car. After surgery it may be brought to your room.  For patients admitted to the hospital, discharge time is determined by your                treatment team.   Patients discharged the day of surgery will not be allowed to drive home.   Please read over the following fact sheets that you were given:   Pain Booklet and MRSA Information   __X__ Take these medicines the morning of surgery with A SIP OF WATER:    1. AMLODIPINE  2. ATORVASTATIN  3. ISOSORBIDE  4. LOSARTAN  5.  6.  ____ Fleet Enema (as directed)   __X__ Use CHG Soap as directed  ____ Use inhalers on the day of surgery  ____ Stop metformin 2 days prior to surgery    ____ Take 1/2 of usual insulin dose the  night before surgery and none on the morning of surgery.   ____ Stop Coumadin/Plavix/aspirin on   __X__ Stop Anti-inflammatories such as Advil, Aleve, Ibuprofen, Motrin, Naproxen, Naprosyn, Goodies,powder, or aspirin products.  OK to take Tylenol.   __X__ Stop supplements until after surgery.    ____ Bring C-Pap to the hospital.

## 2016-10-30 MED ORDER — FAMOTIDINE 20 MG PO TABS
20.0000 mg | ORAL_TABLET | Freq: Once | ORAL | Status: AC
  Administered 2016-10-31: 20 mg via ORAL

## 2016-10-30 MED ORDER — CEFAZOLIN IN D5W 1 GM/50ML IV SOLN
1.0000 g | Freq: Once | INTRAVENOUS | Status: AC
  Administered 2016-10-31: 1 g via INTRAVENOUS

## 2016-10-30 MED ORDER — SODIUM CHLORIDE 0.9 % IR SOLN
Freq: Once | Status: DC
  Filled 2016-10-30: qty 2

## 2016-10-31 ENCOUNTER — Observation Stay
Admission: RE | Admit: 2016-10-31 | Discharge: 2016-11-01 | Disposition: A | Discharge status: Home / Self Care | Payer: Medicare Other | Source: Ambulatory Visit | Attending: Cardiology | Admitting: Cardiology

## 2016-10-31 ENCOUNTER — Ambulatory Visit: Payer: Medicare Other

## 2016-10-31 ENCOUNTER — Ambulatory Visit: Payer: Medicare Other | Admitting: Registered Nurse

## 2016-10-31 ENCOUNTER — Observation Stay: Payer: Medicare Other

## 2016-10-31 ENCOUNTER — Encounter
Admission: RE | Disposition: A | Discharge status: Home / Self Care | Payer: Self-pay | Source: Ambulatory Visit | Attending: Cardiology

## 2016-10-31 ENCOUNTER — Encounter: Payer: Self-pay | Admitting: *Deleted

## 2016-10-31 DIAGNOSIS — E1151 Type 2 diabetes mellitus with diabetic peripheral angiopathy without gangrene: Secondary | ICD-10-CM | POA: Diagnosis not present

## 2016-10-31 DIAGNOSIS — I495 Sick sinus syndrome: Principal | ICD-10-CM | POA: Insufficient documentation

## 2016-10-31 DIAGNOSIS — Z87891 Personal history of nicotine dependence: Secondary | ICD-10-CM | POA: Insufficient documentation

## 2016-10-31 DIAGNOSIS — E1140 Type 2 diabetes mellitus with diabetic neuropathy, unspecified: Secondary | ICD-10-CM | POA: Insufficient documentation

## 2016-10-31 DIAGNOSIS — G894 Chronic pain syndrome: Secondary | ICD-10-CM | POA: Insufficient documentation

## 2016-10-31 DIAGNOSIS — E785 Hyperlipidemia, unspecified: Secondary | ICD-10-CM | POA: Diagnosis not present

## 2016-10-31 DIAGNOSIS — D649 Anemia, unspecified: Secondary | ICD-10-CM | POA: Diagnosis not present

## 2016-10-31 DIAGNOSIS — Z794 Long term (current) use of insulin: Secondary | ICD-10-CM | POA: Insufficient documentation

## 2016-10-31 DIAGNOSIS — E113599 Type 2 diabetes mellitus with proliferative diabetic retinopathy without macular edema, unspecified eye: Secondary | ICD-10-CM | POA: Insufficient documentation

## 2016-10-31 DIAGNOSIS — I441 Atrioventricular block, second degree: Secondary | ICD-10-CM | POA: Insufficient documentation

## 2016-10-31 DIAGNOSIS — E890 Postprocedural hypothyroidism: Secondary | ICD-10-CM | POA: Diagnosis not present

## 2016-10-31 DIAGNOSIS — I10 Essential (primary) hypertension: Secondary | ICD-10-CM | POA: Diagnosis not present

## 2016-10-31 DIAGNOSIS — Z950 Presence of cardiac pacemaker: Secondary | ICD-10-CM | POA: Diagnosis present

## 2016-10-31 DIAGNOSIS — I429 Cardiomyopathy, unspecified: Secondary | ICD-10-CM | POA: Diagnosis not present

## 2016-10-31 DIAGNOSIS — E7800 Pure hypercholesterolemia, unspecified: Secondary | ICD-10-CM | POA: Insufficient documentation

## 2016-10-31 HISTORY — PX: PACEMAKER INSERTION: SHX728

## 2016-10-31 LAB — GLUCOSE, CAPILLARY
GLUCOSE-CAPILLARY: 120 mg/dL — AB (ref 65–99)
GLUCOSE-CAPILLARY: 215 mg/dL — AB (ref 65–99)
Glucose-Capillary: 108 mg/dL — ABNORMAL HIGH (ref 65–99)
Glucose-Capillary: 160 mg/dL — ABNORMAL HIGH (ref 65–99)

## 2016-10-31 SURGERY — INSERTION, CARDIAC PACEMAKER
Anesthesia: General | Site: Chest | Wound class: Clean

## 2016-10-31 MED ORDER — PROPOFOL 500 MG/50ML IV EMUL
INTRAVENOUS | Status: AC
  Filled 2016-10-31: qty 50

## 2016-10-31 MED ORDER — INSULIN DETEMIR 100 UNIT/ML ~~LOC~~ SOLN
20.0000 [IU] | Freq: Two times a day (BID) | SUBCUTANEOUS | Status: DC
  Administered 2016-10-31 – 2016-11-01 (×2): 20 [IU] via SUBCUTANEOUS
  Filled 2016-10-31 (×3): qty 0.2

## 2016-10-31 MED ORDER — PROPOFOL 10 MG/ML IV BOLUS
INTRAVENOUS | Status: DC | PRN
  Administered 2016-10-31: 20 mg via INTRAVENOUS

## 2016-10-31 MED ORDER — BRIMONIDINE TARTRATE 0.2 % OP SOLN
1.0000 [drp] | Freq: Three times a day (TID) | OPHTHALMIC | Status: DC
  Administered 2016-10-31 – 2016-11-01 (×2): 1 [drp] via OPHTHALMIC
  Filled 2016-10-31: qty 5

## 2016-10-31 MED ORDER — ONDANSETRON HCL 4 MG/2ML IJ SOLN: 4.0000 mg | Freq: Once | INTRAMUSCULAR | Status: DC | PRN

## 2016-10-31 MED ORDER — MIDAZOLAM HCL 2 MG/2ML IJ SOLN
INTRAMUSCULAR | Status: AC
  Filled 2016-10-31: qty 2

## 2016-10-31 MED ORDER — INSULIN NPH (HUMAN) (ISOPHANE) 100 UNIT/ML ~~LOC~~ SUSP: 20.0000 [IU] | Freq: Two times a day (BID) | SUBCUTANEOUS | Status: DC

## 2016-10-31 MED ORDER — LOSARTAN POTASSIUM 50 MG PO TABS
100.0000 mg | ORAL_TABLET | Freq: Every day | ORAL | Status: DC
  Administered 2016-11-01: 100 mg via ORAL
  Filled 2016-10-31: qty 2

## 2016-10-31 MED ORDER — LIDOCAINE HCL (PF) 2 % IJ SOLN
INTRAMUSCULAR | Status: AC
  Filled 2016-10-31: qty 2

## 2016-10-31 MED ORDER — GENTAMICIN SULFATE 40 MG/ML IJ SOLN
INTRAMUSCULAR | Status: AC
Start: 2016-10-31 — End: 2016-10-31
  Filled 2016-10-31: qty 2

## 2016-10-31 MED ORDER — FENTANYL CITRATE (PF) 100 MCG/2ML IJ SOLN
INTRAMUSCULAR | Status: DC | PRN
  Administered 2016-10-31 (×2): 25 ug via INTRAVENOUS

## 2016-10-31 MED ORDER — AMLODIPINE BESYLATE 10 MG PO TABS
10.0000 mg | ORAL_TABLET | Freq: Every day | ORAL | Status: DC
  Administered 2016-11-01: 10 mg via ORAL
  Filled 2016-10-31: qty 1

## 2016-10-31 MED ORDER — INSULIN ASPART 100 UNIT/ML ~~LOC~~ SOLN: 0.0000 [IU] | Freq: Every day | SUBCUTANEOUS | Status: DC

## 2016-10-31 MED ORDER — DORZOLAMIDE HCL-TIMOLOL MAL 2-0.5 % OP SOLN
1.0000 [drp] | Freq: Two times a day (BID) | OPHTHALMIC | Status: DC
  Administered 2016-10-31 – 2016-11-01 (×2): 1 [drp] via OPHTHALMIC
  Filled 2016-10-31 (×2): qty 10

## 2016-10-31 MED ORDER — INSULIN ASPART 100 UNIT/ML ~~LOC~~ SOLN
0.0000 [IU] | Freq: Three times a day (TID) | SUBCUTANEOUS | Status: DC
  Filled 2016-10-31: qty 2

## 2016-10-31 MED ORDER — HYDROCHLOROTHIAZIDE 25 MG PO TABS
25.0000 mg | ORAL_TABLET | Freq: Every day | ORAL | Status: DC
  Administered 2016-11-01: 25 mg via ORAL
  Filled 2016-10-31: qty 1

## 2016-10-31 MED ORDER — LIDOCAINE 1 % OPTIME INJ - NO CHARGE
INTRAMUSCULAR | Status: DC | PRN
  Administered 2016-10-31: 30 mL

## 2016-10-31 MED ORDER — GABAPENTIN 300 MG PO CAPS
300.0000 mg | ORAL_CAPSULE | Freq: Every day | ORAL | Status: DC
  Administered 2016-10-31: 300 mg via ORAL
  Filled 2016-10-31: qty 1

## 2016-10-31 MED ORDER — ACETAMINOPHEN 325 MG PO TABS: 325.0000 mg | ORAL_TABLET | ORAL | Status: DC | PRN

## 2016-10-31 MED ORDER — HYDRALAZINE HCL 50 MG PO TABS
100.0000 mg | ORAL_TABLET | Freq: Three times a day (TID) | ORAL | Status: DC
  Administered 2016-10-31 – 2016-11-01 (×2): 100 mg via ORAL
  Filled 2016-10-31 (×2): qty 2

## 2016-10-31 MED ORDER — PROPOFOL 500 MG/50ML IV EMUL
INTRAVENOUS | Status: DC | PRN
  Administered 2016-10-31: 125 ug/kg/min via INTRAVENOUS

## 2016-10-31 MED ORDER — LEVOTHYROXINE SODIUM 88 MCG PO TABS
88.0000 ug | ORAL_TABLET | Freq: Every day | ORAL | Status: DC
  Administered 2016-11-01: 88 ug via ORAL
  Filled 2016-10-31: qty 1

## 2016-10-31 MED ORDER — ISOSORBIDE MONONITRATE ER 30 MG PO TB24
30.0000 mg | ORAL_TABLET | Freq: Every day | ORAL | Status: DC
  Administered 2016-11-01: 30 mg via ORAL
  Filled 2016-10-31: qty 1

## 2016-10-31 MED ORDER — FENTANYL CITRATE (PF) 100 MCG/2ML IJ SOLN: 25.0000 ug | INTRAMUSCULAR | Status: DC | PRN

## 2016-10-31 MED ORDER — LACTULOSE 10 GM/15ML PO SOLN
10.0000 g | Freq: Three times a day (TID) | ORAL | Status: DC
  Administered 2016-11-01: 10 g via ORAL
  Filled 2016-10-31: qty 30

## 2016-10-31 MED ORDER — CEFAZOLIN IN D5W 1 GM/50ML IV SOLN
INTRAVENOUS | Status: AC
  Filled 2016-10-31: qty 50

## 2016-10-31 MED ORDER — METOCLOPRAMIDE HCL 10 MG PO TABS
5.0000 mg | ORAL_TABLET | Freq: Three times a day (TID) | ORAL | Status: DC
  Administered 2016-10-31 – 2016-11-01 (×2): 5 mg via ORAL
  Filled 2016-10-31 (×2): qty 1

## 2016-10-31 MED ORDER — FENTANYL CITRATE (PF) 100 MCG/2ML IJ SOLN
INTRAMUSCULAR | Status: AC
Start: 2016-10-31 — End: 2016-10-31
  Filled 2016-10-31: qty 2

## 2016-10-31 MED ORDER — FAMOTIDINE 20 MG PO TABS
ORAL_TABLET | ORAL | Status: AC
  Administered 2016-10-31: 20 mg via ORAL
  Filled 2016-10-31: qty 1

## 2016-10-31 MED ORDER — SEVOFLURANE IN SOLN
RESPIRATORY_TRACT | Status: AC
  Filled 2016-10-31: qty 250

## 2016-10-31 MED ORDER — SODIUM CHLORIDE 0.9 % IJ SOLN
INTRAMUSCULAR | Status: AC
  Filled 2016-10-31: qty 50

## 2016-10-31 MED ORDER — ONDANSETRON HCL 4 MG/2ML IJ SOLN: 4.0000 mg | Freq: Four times a day (QID) | INTRAMUSCULAR | Status: DC | PRN

## 2016-10-31 MED ORDER — CEFAZOLIN IN D5W 1 GM/50ML IV SOLN
1.0000 g | Freq: Four times a day (QID) | INTRAVENOUS | Status: AC
  Administered 2016-10-31 – 2016-11-01 (×3): 1 g via INTRAVENOUS
  Filled 2016-10-31 (×3): qty 50

## 2016-10-31 MED ORDER — ATORVASTATIN CALCIUM 20 MG PO TABS
40.0000 mg | ORAL_TABLET | Freq: Every day | ORAL | Status: DC
  Administered 2016-10-31: 40 mg via ORAL
  Filled 2016-10-31: qty 2

## 2016-10-31 MED ORDER — SODIUM CHLORIDE 0.9 % IV SOLN
INTRAVENOUS | Status: DC
  Administered 2016-10-31 (×2): via INTRAVENOUS

## 2016-10-31 MED ORDER — LATANOPROST 0.005 % OP SOLN
1.0000 [drp] | Freq: Every day | OPHTHALMIC | Status: DC
  Administered 2016-10-31: 1 [drp] via OPHTHALMIC
  Filled 2016-10-31: qty 2.5

## 2016-10-31 MED ORDER — SODIUM CHLORIDE 0.9 % IV SOLN
INTRAVENOUS | Status: DC
  Administered 2016-10-31: 12:00:00 via INTRAVENOUS

## 2016-10-31 MED ORDER — MIDAZOLAM HCL 2 MG/2ML IJ SOLN
INTRAMUSCULAR | Status: DC | PRN
Start: 2016-10-31 — End: 2016-10-31
  Administered 2016-10-31: 1 mg via INTRAVENOUS

## 2016-10-31 MED ORDER — SODIUM CHLORIDE 0.9 % IR SOLN
Status: DC | PRN
  Administered 2016-10-31: 200 mL

## 2016-10-31 MED ORDER — LIDOCAINE HCL (CARDIAC) 20 MG/ML IV SOLN
INTRAVENOUS | Status: DC | PRN
  Administered 2016-10-31: 40 mg via INTRAVENOUS

## 2016-10-31 SURGICAL SUPPLY — 42 items
BAG DECANTER FOR FLEXI CONT (MISCELLANEOUS) ×3 IMPLANT
BLADE PHOTON ILLUMINATED (MISCELLANEOUS) ×2 IMPLANT
BRUSH SCRUB 4% CHG (MISCELLANEOUS) ×3 IMPLANT
CABLE SURG 12 DISP A/V CHANNEL (MISCELLANEOUS) ×3 IMPLANT
CANISTER SUCT 1200ML W/VALVE (MISCELLANEOUS) ×3 IMPLANT
CHLORAPREP W/TINT 26ML (MISCELLANEOUS) ×3 IMPLANT
COVER LIGHT HANDLE STERIS (MISCELLANEOUS) ×6 IMPLANT
COVER MAYO STAND STRL (DRAPES) ×3 IMPLANT
DECANTER SPIKE VIAL GLASS SM (MISCELLANEOUS) ×3 IMPLANT
DRAPE C-ARM XRAY 36X54 (DRAPES) ×3 IMPLANT
DRSG TEGADERM 4X4.75 (GAUZE/BANDAGES/DRESSINGS) ×3 IMPLANT
DRSG TELFA 4X3 1S NADH ST (GAUZE/BANDAGES/DRESSINGS) ×2 IMPLANT
ELECT REM PT RETURN 9FT ADLT (ELECTROSURGICAL) ×3
ELECTRODE REM PT RTRN 9FT ADLT (ELECTROSURGICAL) ×1 IMPLANT
GLOVE BIO SURGEON STRL SZ7.5 (GLOVE) ×3 IMPLANT
GLOVE BIO SURGEON STRL SZ8 (GLOVE) ×3 IMPLANT
GLOVE INDICATOR 6.5 STRL GRN (GLOVE) ×12 IMPLANT
GOWN STRL REUS W/ TWL LRG LVL3 (GOWN DISPOSABLE) ×2 IMPLANT
GOWN STRL REUS W/ TWL XL LVL3 (GOWN DISPOSABLE) ×1 IMPLANT
GOWN STRL REUS W/TWL LRG LVL3 (GOWN DISPOSABLE) ×4
GOWN STRL REUS W/TWL XL LVL3 (GOWN DISPOSABLE) ×2
IMMOBILIZER SHDR MD LX WHT (SOFTGOODS) IMPLANT
IMMOBILIZER SHDR XL LX WHT (SOFTGOODS) ×3 IMPLANT
INTRO PACEMAKR LEAD 9FR 13CM (INTRODUCER) ×3
INTRO PACEMKR SHEATH II 7FR (MISCELLANEOUS) ×3
INTRODUCER PACEMKR LD 9FR 13CM (INTRODUCER) ×1 IMPLANT
INTRODUCER PACEMKR SHTH II 7FR (MISCELLANEOUS) ×1 IMPLANT
IPG PACE AZUR XT DR MRI W1DR01 (Pacemaker) ×1 IMPLANT
IV NS 500ML (IV SOLUTION) ×2
IV NS 500ML BAXH (IV SOLUTION) ×1 IMPLANT
KIT RM TURNOVER STRD PROC AR (KITS) ×3 IMPLANT
LABEL OR SOLS (LABEL) ×3 IMPLANT
LEAD CAPSURE NOVUS 5076-52CM (Lead) ×3 IMPLANT
LEAD CAPSURE NOVUS 5076-58CM (Lead) ×3 IMPLANT
MARKER SKIN DUAL TIP RULER LAB (MISCELLANEOUS) ×3 IMPLANT
NEEDLE FILTER BLUNT 18X 1/2SAF (NEEDLE) ×2
NEEDLE FILTER BLUNT 18X1 1/2 (NEEDLE) ×1 IMPLANT
PACE AZURE XT DR MRI W1DR01 (Pacemaker) ×3 IMPLANT
PACK PACE INSERTION (MISCELLANEOUS) ×3 IMPLANT
PAD ONESTEP ZOLL R SERIES ADT (MISCELLANEOUS) ×3 IMPLANT
SUT SILK 0 SH 30 (SUTURE) ×3 IMPLANT
SYR 3ML LL SCALE MARK (SYRINGE) ×3 IMPLANT

## 2016-10-31 NOTE — Op Note (Signed)
[facility] Cardiology   10/31/2016                     1:39 PM  PATIENT:  Bruce Bartlett    PRE-OPERATIVE DIAGNOSIS:  SSS  POST-OPERATIVE DIAGNOSIS:  Same  PROCEDURE:  INSERTION PACEMAKER  SURGEON:  Marcina Millard, MD    ANESTHESIA:     PREOPERATIVE INDICATIONS:  Bruce Bartlett is a  61 y.o. male with a diagnosis of SSS who failed conservative measures and elected for surgical management.    The risks benefits and alternatives were discussed with the patient preoperatively including but not limited to the risks of infection, bleeding, cardiopulmonary complications, the need for revision surgery, among others, and the patient was willing to proceed.   OPERATIVE PROCEDURE: The patient was brought to the operating room the fasting state. Left pectoral region was prepped and draped in usual sterile manner. A 6 anesthesia was obtained 1% lidocaine locally. A 6 cm incision was performed her left pectoral region. The pacemaker pocket was generated by electrocautery and blunt dissection. Access was obtained to left subclavian vein by fine needle aspiration. MRI compatible leads were positioned to right ventricular apical septum and right atrial appendage under fluoroscopic guidance. After proper thresholds were obtained the leads were sutured in place. The pacemaker pocket was irrigated with gentamicin solution. The leads were connected to a MRI compatible dual-chamber rate responsive pacemaker generator (Medtronic Azure XT DR MRI Salvadore Farber [id number]). The pacemaker generator was positioned into the pocket and the pocket was closed with 2-0 and 4-0 Vicryl, respectively. Steri-Strips and a pressure dressing were applied.

## 2016-10-31 NOTE — Progress Notes (Signed)
Blood pressure 196/92   No new orders from dr Bruce Bartlett

## 2016-10-31 NOTE — Progress Notes (Signed)
Patient said that he requested a Chaplain because he needed prayers. Patient said that his heart was not beating normally. Patient and his wife sounded as people of faith who are devoted in their belief system. Chaplain prayed for patient along with his wife.   10/31/16 2000  Clinical Encounter Type  Visited With Patient and family together  Visit Type Initial  Referral From Nurse  Consult/Referral To Chaplain  Spiritual Encounters  Spiritual Needs Prayer

## 2016-10-31 NOTE — Anesthesia Preprocedure Evaluation (Signed)
Anesthesia Evaluation  Patient identified by MRN, date of birth, ID band Patient awake    Reviewed: Allergy & Precautions, NPO status , Patient's Chart, lab work & pertinent test results  History of Anesthesia Complications Negative for: history of anesthetic complications  Airway Mallampati: III       Dental  (+) Poor Dentition, Chipped, Missing   Pulmonary neg pulmonary ROS, former smoker,           Cardiovascular hypertension, Pt. on medications + Peripheral Vascular Disease  + dysrhythmias (bradycardia)      Neuro/Psych CVA (R leg weakness), Residual Symptoms    GI/Hepatic negative GI ROS, Neg liver ROS,   Endo/Other  diabetes, Type 2, Insulin DependentHypothyroidism   Renal/GU Renal InsufficiencyRenal disease     Musculoskeletal   Abdominal   Peds  Hematology negative hematology ROS (+)   Anesthesia Other Findings   Reproductive/Obstetrics                             Anesthesia Physical Anesthesia Plan  ASA: III  Anesthesia Plan: General   Post-op Pain Management:    Induction:   Airway Management Planned: Nasal Cannula  Additional Equipment:   Intra-op Plan:   Post-operative Plan:   Informed Consent: I have reviewed the patients History and Physical, chart, labs and discussed the procedure including the risks, benefits and alternatives for the proposed anesthesia with the patient or authorized representative who has indicated his/her understanding and acceptance.     Plan Discussed with:   Anesthesia Plan Comments:         Anesthesia Quick Evaluation

## 2016-10-31 NOTE — H&P (Signed)
<6>29299-5<7>Encounter Details<6>46240-8<7>Social History<6>29762-2<7>Last Filed Vital Signs<6>8716-3<7>Instructions<6>69730-0<7>Progress Notes<6>10164-2<7>Plan of Treatment<6>18776-5<7>Goals<6>61146-7<7>Visit Diagnoses<6>51848-0<7>/"> Jump to Section ? Document InformationEncounter DetailsGoalsInstructionsLast Filed Vital SignsPatient DemographicsPlan of TreatmentProgress NotesReason for VisitSocial HistoryVisit Diagnoses Bruce Bartlett Encounter Summary, generated on Apr. 12, 2018 Printout Information  Document Contents Office Visit Document Received Date Apr. 12, 2018 Document Source Organization [facility]   Patient Demographics - 61 y.o. Male, born [birth year]   Patient Address Communication Language Race / Ethnicity  [address] [place], [place] [address] [phone] [facility]) [phone] (Mobile) English (Preferred) Black or African American / Not Hispanic or Latino  Reason for Visit    Reason Comments  discuss holter results low heart rate  Fatigue   Shortness of Breath     Encounter Details    Date Type Department Care Team Description  10/28/2016 Office Visit [facility]  [address]  [place], [place] [address]  [phone]  Denton Ar, MD  [address] [facility] [address]  [facility] [address] - CARDIOLOGY  [place], [place] [address]  [phone]  [phone] (Fax)  Bradycardia (Primary Dx);  Essential hypertension;  Pure hypercholesterolemia;  Peripheral vascular disease (CMS-HCC);  Diabetes mellitus with peripheral vascular disease (CMS-HCC)   Social History - as of this encounter   Tobacco Use Types Packs/Day Years Used Date  Former Smoker Cigars     Smokeless Tobacco: Never Used      Alcohol Use Drinks/Week oz/Week Comments  No 0 Standard drinks or equivalent  0.0     Sex Assigned at [employer] Date Recorded  Not on file    Last Filed Vital Signs - in this encounter   Vital  Sign Reading Time Taken  Blood Pressure 150/80 10/28/2016 10:41 AM EDT  Pulse 44 10/28/2016 10:41 AM EDT  Temperature - -  Respiratory Rate 12 10/28/2016 10:41 AM EDT  Oxygen Saturation - -  Inhaled Oxygen Concentration - -  Weight 114.3 kg (252 lb) 10/28/2016 10:41 AM EDT  Height 181.6 cm (5' 11.5") 10/28/2016 10:41 AM EDT  Body Mass Index 34.66 10/28/2016 10:41 AM EDT   Instructions - in this encounter   Patient Instructions - Denton Ar, MD - 10/28/2016 10:30 AM EDT Patient Education   Pacemaker Implantation, Adult Pacemaker implantation is a procedure to place a pacemaker inside your chest. A pacemaker is a small computer that sends electrical signals to the heart and helps your heart beat normally. A pacemaker also stores information about your heart rhythms. You may need pacemaker implantation if you:  Have a slow heartbeat (bradycardia).  Faint (syncope).  Have shortness of breath (dyspnea) due to heart problems. The pacemaker attaches to your heart through a wire, called a lead. Sometimes just one lead is needed. Other times, there will be two leads. There are two types of pacemakers:  Transvenous pacemaker. This type is placed under the skin or muscle of your chest. The lead goes through a vein in the chest area to reach the inside of the heart.  Epicardial pacemaker. This type is placed under the skin or muscle of your chest or belly. The lead goes through your chest to the outside of the heart. Tell a health care provider about:  Any allergies you have.  All medicines you are taking, including vitamins, herbs, eye drops, creams, and over-the-counter medicines.  Any problems you or family members have had with anesthetic medicines.  Any blood or bone disorders you have.  Any surgeries you have had.  Any medical conditions you have.  Whether  you are pregnant or may be pregnant. What are the risks? Generally, this is a safe procedure. However, problems  may occur, including:  Infection.  Bleeding.  Failure of the pacemaker or the lead.  Collapse of a lung or bleeding into a lung.  Blood clot inside a blood vessel with a lead.  Damage to the heart.  Infection inside the heart (endocarditis).  Allergic reactions to medicines. What happens before the procedure? Staying hydrated  Follow instructions from your health care provider about hydration, which may include:  Up to 2 hours before the procedure - you may continue to drink clear liquids, such as water, clear fruit juice, black coffee, and plain tea. Eating and drinking restrictions  Follow instructions from your health care provider about eating and drinking, which may include:  8 hours before the procedure - stop eating heavy meals or foods such as meat, fried foods, or fatty foods.  6 hours before the procedure - stop eating light meals or foods, such as toast or cereal.  6 hours before the procedure - stop drinking milk or drinks that contain milk.  2 hours before the procedure - stop drinking clear liquids. Medicines   Ask your health care provider about:  Changing or stopping your regular medicines. This is especially important if you are taking diabetes medicines or blood thinners.  Taking medicines such as aspirin and ibuprofen. These medicines can thin your blood. Do not take these medicines before your procedure if your health care provider instructs you not to.  You may be given antibiotic medicine to help prevent infection. General instructions   You will have a heart evaluation. This may include an electrocardiogram (ECG), chest X-ray, and heart imaging (echocardiogram, or echo) tests.  You will have blood tests.  Do not use any products that contain nicotine or tobacco, such as cigarettes and e-cigarettes. If you need help quitting, ask your health care provider.  Plan to have someone take you home from the hospital or clinic.  If you will be going  home right after the procedure, plan to have someone with you for 24 hours.  Ask your health care provider how your surgical site will be marked or identified. What happens during the procedure?  To reduce your risk of infection:  Your health care team will wash or sanitize their hands.  Your skin will be washed with soap.  Hair may be removed from the surgical area.  An IV tube will be inserted into one of your veins.  You will be given one or more of the following:  A medicine to help you relax (sedative).  A medicine to numb the area (local anesthetic).  A medicine to make you fall asleep (general anesthetic).  If you are getting a transvenous pacemaker:  An incision will be made in your upper chest.  A pocket will be made for the pacemaker. It may be placed under the skin or between layers of muscle.  The lead will be inserted into a blood vessel that returns to the heart.  While X-rays are taken by an imaging machine (fluoroscopy), the lead will be advanced through the vein to the inside of your heart.  The other end of the lead will be tunneled under the skin and attached to the pacemaker.  If you are getting an epicardial pacemaker:  An incision will be made near your ribs or breastbone (sternum) for the lead.  The lead will be attached to the outside of your heart.  Another incision will be made in your chest or upper belly to create a pocket for the pacemaker.  The free end of the lead will be tunneled under the skin and attached to the pacemaker.  The transvenous or epicardial pacemaker will be tested. Imaging studies may be done to check the lead position.  The incisions will be closed with stitches (sutures), adhesive strips, or skin glue.  Bandages (dressing) will be placed over the incisions. The procedure may vary among health care providers and hospitals. What happens after the procedure?  Your blood pressure, heart rate, breathing rate, and blood  oxygen level will be monitored until the medicines you were given have worn off.  You will be given antibiotics and pain medicine.  ECG and chest x-rays will be done.  You will wear a continuous type of ECG (Holter monitor) to check your heart rhythm.  Your health care provider willprogram the pacemaker.  Do not drive for 24 hours if you received a sedative. This information is not intended to replace advice given to you by your health care provider. Make sure you discuss any questions you have with your health care provider. Document Released: 06/28/2002 Document Revised: 01/26/2016 Document Reviewed: 12/20/2015 Elsevier Interactive Patient Education  2017 [employer].      Progress Notes - in this encounter   Fath, Glenetta Borg, MD - 10/28/2016 10:30 AM EDT Formatting of this note may be different from the original.   Chief Complaint: Chief Complaint  Patient presents with  . discuss holter results  low heart rate  . Fatigue  . Shortness of Breath  Date of Service: 10/28/2016 Date of Birth: 03/22/1956 PCP: Marguarite Arbour, MD, MD  History of Present Illness: Mr. Raczka is a 61 y.o.male patient who returns for follow-up visit. Patient has a history of diabetes mellitus, cardiomyopathy as well as hyperlipidemia and hypertension. He recently had noted some weakness and fatigue. Holter monitor was completed on March 22 which revealed sinus rhythm with intermittent second-degree heart block and bradycardia. Longest pause was 2.46 seconds. Many of the episodes of bradycardia occurred during sleeping hours however there are several that occurred during activity time. Patient denies syncope but gets lightheaded on occasion. He denies chest pain.  Past Medical and Surgical History  Past Medical History Past Medical History:  Diagnosis Date  . Anemia, unspecified  . Cardiomyopathy (CMS-HCC)  by echo  . Diabetes mellitus type 2, uncomplicated (CMS-HCC)  a. diagnosis 1990s b.  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 Diabetic peripheral neuropathy  . ED (erectile dysfunction)  . Epicondylitis  . Herpes zoster  . History of Bell's palsy  . Hyperlipidemia, unspecified  . Hypertension  . Hypothyroidism, unspecified  post-surgical  . Multinodular goiter  . Peripheral vascular disease (CMS-HCC) 2012  s/p angioplasty Rt leg (Dr Wyn Quaker)  . Post herpetic neuralgia  with chronic pain syndrome   Past Surgical History He has a past surgical history that includes Back surgery; Retina Eye Surgery; toe amputation (Right, 2012); and Thyroid surgery (1993).   Medications and Allergies  Current Medications  Current Outpatient Prescriptions  Medication Sig Dispense Refill  . amLODIPine (NORVASC) 10 MG tablet Take 1 tablet (10 mg total) by mouth once daily. 90 tablet 1  . atorvastatin (LIPITOR) 40 MG tablet Take 1 tablet (40 mg total) by mouth once daily. 90 tablet 3  . brimonidine (ALPHAGAN) 0.2 % ophthalmic solution 1 drop every 8 (eight) hours.  . dorzolamide-timolol (COSOPT) 22.3-6.8  mg/mL ophthalmic solution 1 drop 2 (two) times daily.  Marland Kitchen gabapentin (NEURONTIN) 300 MG capsule Take 1 capsule (300 mg total) by mouth nightly. 90 capsule 3  . hydrALAZINE (APRESOLINE) 100 MG tablet Take 1 tablet (100 mg total) by mouth 3 (three) times daily. 270 tablet 1  . hydroCHLOROthiazide (HYDRODIURIL) 25 MG tablet Take 1 tablet (25 mg total) by mouth once daily. 90 tablet 1  . HYDROcodone-acetaminophen (NORCO) 5-325 mg tablet Take 1 tablet by mouth every 6 (six) hours as needed for Pain.  Marland Kitchen ibuprofen (ADVIL,MOTRIN) 800 MG tablet Take 1 tablet (800 mg total) by mouth every 6 (six) hours as needed. 90 tablet 1  . insulin NPH (NOVOLIN N) 100 unit/mL injection Inject 20 Units subcutaneously 2 (two) times daily before meals. 10 mL 3  . insulin REGULAR (NOVOLIN R) injection (concentration 100 units/mL) 15 UNITS BEFORE BREAKFAST AND SUPPER. 10 UNITS  BEFORE LUNCH. (Patient taking differently: Inject 20-30 Units subcutaneously 2 (two) times daily.  ) 10 mL 3  . isosorbide mononitrate (IMDUR) 30 MG ER tablet Take 1 tablet (30 mg total) by mouth once daily. 90 tablet 1  . lactulose (CEPHULAC) 10 gram packet Take 1 packet (10 g total) by mouth 3 (three) times daily. 30 each 11  . latanoprost (XALATAN) 0.005 % ophthalmic solution Place 1 drop into both eyes once daily. 7.5 mL 3  . levothyroxine (SYNTHROID, LEVOTHROID) 88 MCG tablet Take 1 tablet (88 mcg total) by mouth once daily. Take on an empty stomach with a glass of water at least 30-60 minutes before breakfast. 90 tablet 1  . losartan (COZAAR) 100 MG tablet Take 1 tablet (100 mg total) by mouth once daily. 90 tablet 1  . metoclopramide (REGLAN) 5 MG tablet Take 5 mg by mouth 3 (three) times a day.  Letta Pate ULTRA TEST test strip Use 3 (three) times daily. Use as instructed. 300 each 3   No current facility-administered medications for this visit.   Allergies: Patient has no known allergies.  Social and Family History  Social History reports that he has quit smoking. His smoking use included Cigars. He has never used smokeless tobacco. He reports that he does not drink alcohol or use drugs.  Family History Family History  Problem Relation Age of Onset  . Stroke Mother  . Coronary Artery Disease (Blocked arteries around heart) Father   Review of Systems  Review of Systems  Constitutional: Positive for malaise/fatigue. Negative for chills, diaphoresis, fever and weight loss.  HENT: Negative for congestion, ear discharge, hearing loss and tinnitus.  Eyes: Negative for blurred vision.  Respiratory: Positive for shortness of breath. Negative for cough, hemoptysis, sputum production and wheezing.  Cardiovascular: Negative for chest pain, palpitations, orthopnea, claudication and PND.  Gastrointestinal: Negative for abdominal pain, blood in stool, constipation, diarrhea, heartburn,  melena, nausea and vomiting.  Genitourinary: Negative for dysuria, frequency, hematuria and urgency.  Musculoskeletal: Negative for back pain, falls, joint pain and myalgias.  Skin: Negative for itching and rash.  Neurological: Positive for dizziness and weakness. Negative for tingling, focal weakness, loss of consciousness and headaches.  Endo/Heme/Allergies: Negative for polydipsia. Does not bruise/bleed easily.  Psychiatric/Behavioral: Negative for depression, memory loss and substance abuse. The patient is not nervous/anxious.    Physical Examination   Vitals: BP 150/80  Pulse (!) 44  Resp 12  Ht 181.6 cm (5' 11.5")  Wt (!) 114.3 kg (252 lb)  BMI 34.66 kg/m  Ht:181.6 cm (5' 11.5") Wt:(!) 114.3 kg (252 lb)  WUJ:WJXB surface area is 2.4 meters squared. Body mass index is 34.66 kg/m.  Wt Readings from Last 3 Encounters:  10/28/16 (!) 114.3 kg (252 lb)  10/10/16 (!) 108.4 kg (239 lb)  09/23/16 (!) 115.2 kg (254 lb)   BP Readings from Last 3 Encounters:  10/28/16 150/80  10/10/16 142/76  09/11/16 142/68  general: African American male in no acute distress  LUNGS Breath Sounds: Normal Percussion: Normal  CARDIOVASCULAR JVP CV wave: no HJR: no Elevation at 90 degrees: None Carotid Pulse: normal pulsation bilaterally Bruit: None Apex: apical impulse normal  Auscultation Rhythm: sinus bradycardia, rate=44 S1: normal S2: normal Clicks: no Rub: no Murmurs: 1/6 medium pitched mid systolic blowing at apex  Gallop: None ABDOMEN Liver enlargement: no Pulsatile aorta: no Ascites: no Bruits: no  EXTREMITIES Clubbing: no Edema: 1+ bilateral pedal edema Pulses: peripheral pulses symmetrical Femoral Bruits: no Amputation: no SKIN Rash: no Cyanosis: no Embolic phemonenon: no Bruising: no NEURO Alert and Oriented to person, place and time: yes Non focal: yes LABS Last 3 CBC results: Lab Results  Component Value Date  WBC 7.2 07/01/2016  WBC 8.1 05/24/2016   WBC 6.3 01/11/2016   Lab Results  Component Value Date  HGB 12.8 (L) 07/01/2016  HGB 12.0 (L) 05/24/2016  HGB 13.1 (L) 01/11/2016   Lab Results  Component Value Date  HCT 40.2 07/01/2016  HCT 37.0 (L) 05/24/2016  HCT 41.1 01/11/2016   Lab Results  Component Value Date  PLT 283 07/01/2016  PLT 248 05/24/2016  PLT 210 01/11/2016   Lab Results  Component Value Date  CREATININE 1.7 (H) 08/22/2016  BUN 27 (H) 08/22/2016  NA 139 08/22/2016  K 4.2 08/22/2016  CL 103 08/22/2016  CO2 32.5 (H) 08/22/2016   Lab Results  Component Value Date  HGBA1C 7.6 (H) 08/22/2016   Lab Results  Component Value Date  HDL 40.7 01/11/2016  HDL 35.5 06/23/2015  HDL 38.2 11/11/2014   Lab Results  Component Value Date  LDLCALC 90 01/11/2016  LDLCALC 81 06/23/2015  LDLCALC 75 11/11/2014   Lab Results  Component Value Date  TRIG 89 01/11/2016  TRIG 56 06/23/2015  TRIG 71 11/11/2014   Lab Results  Component Value Date  ALT 35 05/24/2016  AST 26 05/24/2016  ALKPHOS 51 05/24/2016   Lab Results  Component Value Date  TSH 4.788 01/11/2016   Assessment and Plan   61 y.o. male with  ICD-10-CM ICD-9-CM  1. Cardiomyopathy-echo and functional studies suggests preserved LV function with EF greater than 50-55%. Will continue with current regimen including amlodipine, hydrochlorothiazide and losartan and follow. Low-sodium diet is recommended. Daily weights are recommended. I42.9 425.4  2. Pure hypercholesterolemia-hyperlipidemia is being treated with atorvastatin as well as diet. LDL goal is less than 100. Most recent value is 90. Have encouraged him to continue with atorvastatin as well as a low-fat diet E78.0 272.0  3. Essential hypertension-blood pressure is controlled with amlodipine, hydrochlorothiazide and losartan. This along with weight loss and dash diet was recommended. I10 401.9  4. Diabetes mellitus type 2, uncomplicated-patient is hemoglobin A1c was elevated somewhat during  most recent check. Most recent value was 7.9. Compliance with an ADA diet and an hemoglobin A1c less than 6 is recommended. E11.9 250.00  5. Shortness of breath on exertion-continue with weight loss and daily exercise as tolerated. Likely secondary to bradycardia R06.02 786.05  6. Hyperthyroidism-most recent tsh is 4.788. This is being followed by his primary care provider E05.90 242.90   7. Holter  monitor was read as showing bradycardia with rates down in the 20s. Longest pause was 2.46 seconds. Patient is on no AV nodal drugs. Discussed permanent pacemaker risk and benefits. We will proceed with permanent pacemaker for second-degree heart block with a dual-chamber device.  Return in about 4 weeks (around 11/25/2016).  These notes generated with voice recognition software. I apologize for typographical errors.  Denton Ar, MD      Plan of Treatment - as of this encounter   Upcoming Encounters Upcoming Encounters  Date Type Specialty Care Team Description  11/26/2016 Office Visit Cardiology Fath, Glenetta Borg, MD  [address] [facility] [address]  [facility] [address] - CARDIOLOGY  [place], [place] [address]  [phone]  [phone] (Fax)    12/03/2016 Ancillary Orders Lab Raelene Bott, MD  [address] [facility] [address]  [facility] [place], [place] [address]  [phone]  [phone] ([phone]    12/10/2016 Office Visit Endocrinology Solum, Rhae Hammock, MD  [address] [facility] [address]  [facility] [place], [place] [address]  [phone]  [phone] (Fax)    12/17/2016 Patient Outreach [facility] Soyla Murphy, RN  [address]  [place], [place] [address]  [phone]  [phone] ([phone]    12/24/2016 Office Visit Podiatry Troxler, Sofie Rower, DPM  [address] [facility] [address]  [facility]  [place], [place] [address]  [phone]  [phone] (Fax)    01/10/2017 Office Visit Internal Medicine Marguarite Arbour, MD  [address]  [facility] [place], [place] [address]  [phone]  [phone] ([phone]    06/25/2017 Office Visit Neurology Jolene Provost, MD  [phone] [facility] [address]  [facility]  [place], [place] [address]  [phone]  [phone] ([address])    Jimmye Norman, NP  [address] [facility] [address]  [facility]  [place], [place] [address]  [phone]  [phone] (Fax)     Scheduled Tests Scheduled Tests  Name Priority Associated Diagnoses Order Schedule  Basic Metabolic Panel (BMP) Routine Bradycardia  Ordered: 10/28/2016  CBC w/auto Differential (3 Part) Routine Bradycardia  Ordered: 10/28/2016   Goals - as of this encounter   Patient Goal Type Goal Recent Progress Patient-Stated? Author  Result Component HEMOGLOBIN A1C < 7.0  7.6 (08/22/2016 10:59 AM EST) No Beaudin, Robin, RN   Visit Diagnoses    Diagnosis  Bradycardia - Primary  Other specified cardiac dysrhythmias   Essential hypertension  Pure hypercholesterolemia  Peripheral vascular disease (CMS-HCC)  Unspecified peripheral vascular disease   Diabetes mellitus with peripheral vascular disease (CMS-HCC)   Images Document Information   Primary Care Provider Marguarite Arbour MD (Apr. 01, 2015 - Present) [phone] (Work) [phone] (Fax) [address] [facility] [place], [place] [address]  Other Service Providers Katie Michel Santee RN (Registered Nurse)   Soyla Murphy RN (Registered Nurse) [phone] (Work) [phone] (Fax) [address] [place], [place] [address]   Document Coverage Dates Apr. 09, 2018  Custodian Organization [facility] [place], [place] [address]   Encounter Providers Denton Ar MD (Attending) [phone] (Work) [phone] (Fax) [phone] [facility] [address] [facility] - CARDIOLOGY [place], [place] [address]   Encounter Date Apr. 09, 2018   Show All  Sections

## 2016-10-31 NOTE — Anesthesia Post-op Follow-up Note (Cosign Needed)
Anesthesia QCDR form completed.        

## 2016-10-31 NOTE — Progress Notes (Signed)
Last blood pressure 192/87   No new orders from dr Henrene Hawking

## 2016-10-31 NOTE — Interval H&P Note (Signed)
History and Physical Interval Note:  10/31/2016 12:25 PM  Bruce Bartlett  has presented today for surgery, with the diagnosis of SSS  The various methods of treatment have been discussed with the patient and family. After consideration of risks, benefits and other options for treatment, the patient has consented to  Procedure(s): INSERTION PACEMAKER (N/A) as a surgical intervention .  The patient's history has been reviewed, patient examined, no change in status, stable for surgery.  I have reviewed the patient's chart and labs.  Questions were answered to the patient's satisfaction.     [name]

## 2016-10-31 NOTE — Progress Notes (Signed)
Radial pulse to left wrist area and arm sling on

## 2016-10-31 NOTE — Anesthesia Procedure Notes (Signed)
Date/Time: 10/31/2016 12:33 PM Performed by: Karoline Caldwell Pre-anesthesia Checklist: Patient identified, Emergency Drugs available, Suction available and Patient being monitored Patient Re-evaluated:Patient Re-evaluated prior to inductionOxygen Delivery Method: Simple face mask

## 2016-10-31 NOTE — Transfer of Care (Signed)
Immediate Anesthesia Transfer of Care Note  Patient: Bruce Bartlett  Procedure(s) Performed: Procedure(s): INSERTION PACEMAKER (N/A)  Patient Location: PACU  Anesthesia Type:General  Level of Consciousness: sedated  Airway & Oxygen Therapy: Patient Spontanous Breathing and Patient connected to face mask oxygen  Post-op Assessment: Report given to RN and Post -op Vital signs reviewed and stable  Post vital signs: Reviewed and stable  Last Vitals:  Vitals:   10/31/16 1028 10/31/16 1335  BP: (!) 176/83 (!) 176/71  Pulse: 64 (!) 59  Resp: 18 16  Temp: 36.4 C 36.5 C    Last Pain:  Vitals:   10/31/16 1028  TempSrc: Oral         Complications: No apparent anesthesia complications

## 2016-11-01 DIAGNOSIS — I495 Sick sinus syndrome: Secondary | ICD-10-CM | POA: Diagnosis not present

## 2016-11-01 LAB — GLUCOSE, CAPILLARY
GLUCOSE-CAPILLARY: 110 mg/dL — AB (ref 65–99)
Glucose-Capillary: 206 mg/dL — ABNORMAL HIGH (ref 65–99)

## 2016-11-01 MED ORDER — CEPHALEXIN 250 MG PO CAPS: 250.0000 mg | ORAL_CAPSULE | Freq: Four times a day (QID) | ORAL | 0 refills | Status: DC

## 2016-11-01 NOTE — Progress Notes (Signed)
Pnt has has an uneventful evening at this time in the shift. Pnt has denied pain or discomfort. Pnt wife at bedside and is very supportive. Dressing to left anterior chest wall has scant old drainage. No concerns at this time will continue to monitor and assess. Left arm remains in sling and should elevated on pillow. Pnt reminded not to apply pressure or lie on the left side. Bed low and locked, call bell in reach.

## 2016-11-01 NOTE — Care Management Obs Status (Signed)
MEDICARE OBSERVATION STATUS NOTIFICATION   Patient Details  Name: Bruce Bartlett MRN: [id number] Date of Birth: 1955/10/17   Medicare Observation Status Notification Given:  No  Less than 24 hours. Had outpatient I bed procedure without complicaitons    Eber Hong, RN 11/01/2016, 12:19 PM

## 2016-11-01 NOTE — Plan of Care (Signed)
Problem: Health Behavior/Discharge Planning: Goal: Ability to manage health-related needs will improve Outcome: Completed/Met Date Met: 11/01/16 He and his wife are clear on his post procedure care and follow up

## 2016-11-01 NOTE — Discharge Instructions (Signed)
Avoid lifting left arm above head. May remove outer dressing on 11/02/2016. Leave Steri-Strips in place. May shower 11/02/2016.

## 2016-11-01 NOTE — Discharge Summary (Signed)
Physician Discharge Summary  Patient ID: Bruce Bartlett MRN: [id number] DOB/AGE: Dec 06, 1955 60 y.o.  Admit date: 10/31/2016 Discharge date: 11/01/2016  Primary Discharge Diagnosis Type II second degree AV block Secondary Discharge Diagnosis bradycardia  Significant Diagnostic Studies: yes  Consults: None  Hospital Course: The patient underwent elective dual-chamber pacemaker implantation on 10/31/2016. She was returned to telemetry where he's had an uncomplicated course. Telemetry revealed dual-chamber pacing. On the morning of 11/01/2016 the patient was ambulating without difficulty and was discharged home.   Discharge Exam: Blood pressure (!) 177/85, pulse 63, temperature 98 F (36.7 C), temperature source Oral, resp. rate 17, height 6' 1.5" (1.867 m), weight 114.3 kg (252 lb), SpO2 97 %.   General appearance: alert Head: Normocephalic, without obvious abnormality, atraumatic Eyes: conjunctivae/corneas clear. PERRL, EOM's intact. Fundi benign. Ears: normal TM's and external ear canals both ears Nose: Nares normal. Septum midline. Mucosa normal. No drainage or sinus tenderness. Throat: lips, mucosa, and tongue normal; teeth and gums normal Neck: no adenopathy, no carotid bruit, no JVD, supple, symmetrical, trachea midline and thyroid not enlarged, symmetric, no tenderness/mass/nodules Back: symmetric, no curvature. ROM normal. No CVA tenderness. Resp: clear to auscultation bilaterally Chest wall: no tenderness Cardio: regular rate and rhythm, S1, S2 normal, no murmur, click, rub or gallop GI: soft, non-tender; bowel sounds normal; no masses,  no organomegaly Extremities: extremities normal, atraumatic, no cyanosis or edema Pulses: 2+ and symmetric Skin: Skin color, texture, turgor normal. No rashes or lesions Labs:   Lab Results  Component Value Date   WBC 6.8 10/29/2016   HGB 12.7 (L) 10/29/2016   HCT 40.5 10/29/2016   MCV 73.3 (L) 10/29/2016   PLT 199 10/29/2016     Recent Labs Lab 10/29/16 1412  NA 141  K 4.1  CL 106  CO2 31  BUN 24*  CREATININE 1.53*  CALCIUM 9.2  GLUCOSE 70      Radiology: No pneumothorax EKG: AV sequential pacing  FOLLOW UP PLANS AND APPOINTMENTS  Allergies as of 11/01/2016   No Known Allergies     Medication List    TAKE these medications   amLODipine 10 MG tablet Commonly known as:  NORVASC Take 1 tablet by mouth daily.   atorvastatin 40 MG tablet Commonly known as:  LIPITOR Take 1 tablet by mouth daily.   brimonidine 0.2 % ophthalmic solution Commonly known as:  ALPHAGAN Place 1 drop into both eyes 2 (two) times daily.   cephALEXin 250 MG capsule Commonly known as:  KEFLEX Take 1 capsule (250 mg total) by mouth 4 (four) times daily.   dorzolamidel-timolol 22.3-6.8 MG/ML Soln ophthalmic solution Commonly known as:  COSOPT Place 1 drop into both eyes at bedtime.   gabapentin 300 MG capsule Commonly known as:  NEURONTIN Take 300 mg by mouth at bedtime.   HUMALOG KWIKPEN 100 UNIT/ML KiwkPen Generic drug:  insulin lispro Inject 15 Units into the skin 2 (two) times daily.   hydrALAZINE 100 MG tablet Commonly known as:  APRESOLINE Take 1 tablet (100 mg total) by mouth 3 (three) times daily.   hydrochlorothiazide 25 MG tablet Commonly known as:  HYDRODIURIL Take 25 mg by mouth daily.   ibuprofen 800 MG tablet Commonly known as:  ADVIL,MOTRIN Take 800 mg by mouth every 6 (six) hours as needed.   insulin NPH Human 100 UNIT/ML injection Commonly known as:  HUMULIN N,NOVOLIN N Inject 20 Units into the skin 2 (two) times daily before a meal.   insulin regular 250 units/2.47mL (  100 units/mL) injection Commonly known as:  NOVOLIN R,HUMULIN R Inject 15 Units into the skin 2 (two) times daily before a meal.   isosorbide mononitrate 30 MG 24 hr tablet Commonly known as:  IMDUR Take 1 tablet by mouth daily.   lactulose 10 g packet Commonly known as:  CEPHULAC Take 10 g by mouth 3 (three) times  daily as needed.   latanoprost 0.005 % ophthalmic solution Commonly known as:  XALATAN Place 1 drop into both eyes at bedtime.   losartan 100 MG tablet Commonly known as:  COZAAR Take 1 tablet by mouth daily.   metoCLOPramide 5 MG tablet Commonly known as:  REGLAN Take 1 tablet (5 mg total) by mouth every 8 (eight) hours as needed for nausea or vomiting.   omega-3 acid ethyl esters 1 g capsule Commonly known as:  LOVAZA Take 1 g by mouth daily.   vitamin B-12 1000 MCG tablet Commonly known as:  CYANOCOBALAMIN Take 1,000 mcg by mouth daily.      Follow-up Information    Dalia Heading, MD Follow up in 1 week(s).   Specialty:  Cardiology Contact information: [address] [facility] - CARDIOLOGY [place] [address] [phone]           BRING ALL MEDICATIONS WITH YOU TO FOLLOW UP APPOINTMENTS  Time spent with patient to include physician time: 25 minutes Signed:  Marcina Millard MD, PhD, [facility] 11/01/2016, 8:04 AM

## 2016-11-01 NOTE — Progress Notes (Signed)
Discharged to home with wife.  Instructions regarding his antibiotic prescription and post pacemaker care.

## 2016-11-05 NOTE — Anesthesia Postprocedure Evaluation (Signed)
Anesthesia Post Note  Patient: Bruce Bartlett  Procedure(s) Performed: Procedure(s) (LRB): INSERTION PACEMAKER (N/A)  Patient location during evaluation: PACU Anesthesia Type: General Level of consciousness: awake and alert Pain management: pain level controlled Vital Signs Assessment: post-procedure vital signs reviewed and stable Respiratory status: spontaneous breathing and respiratory function stable Cardiovascular status: stable Anesthetic complications: no     Last Vitals:  Vitals:   11/01/16 0802 11/01/16 1049  BP: (!) 177/85 (!) 183/74  Pulse: 63 61  Resp: 17   Temp: 36.7 C     Last Pain:  Vitals:   11/01/16 0802  TempSrc: Oral                 KEPHART,WILLIAM K

## 2017-03-07 ENCOUNTER — Encounter (INDEPENDENT_AMBULATORY_CARE_PROVIDER_SITE_OTHER): Payer: Medicare Other

## 2017-03-07 ENCOUNTER — Ambulatory Visit (INDEPENDENT_AMBULATORY_CARE_PROVIDER_SITE_OTHER): Payer: Medicare Other | Admitting: Vascular Surgery

## 2017-03-10 ENCOUNTER — Other Ambulatory Visit (INDEPENDENT_AMBULATORY_CARE_PROVIDER_SITE_OTHER): Payer: Self-pay | Admitting: Vascular Surgery

## 2017-03-10 DIAGNOSIS — I7090 Unspecified atherosclerosis: Secondary | ICD-10-CM

## 2017-03-11 ENCOUNTER — Ambulatory Visit (INDEPENDENT_AMBULATORY_CARE_PROVIDER_SITE_OTHER): Payer: Medicare Other

## 2017-03-11 ENCOUNTER — Ambulatory Visit (INDEPENDENT_AMBULATORY_CARE_PROVIDER_SITE_OTHER): Payer: Medicare Other | Admitting: Vascular Surgery

## 2017-03-11 ENCOUNTER — Encounter (INDEPENDENT_AMBULATORY_CARE_PROVIDER_SITE_OTHER): Payer: Self-pay | Admitting: Vascular Surgery

## 2017-03-11 VITALS — BP 156/78 | HR 77 | Resp 16 | Wt 248.0 lb

## 2017-03-11 DIAGNOSIS — E119 Type 2 diabetes mellitus without complications: Secondary | ICD-10-CM

## 2017-03-11 DIAGNOSIS — I739 Peripheral vascular disease, unspecified: Secondary | ICD-10-CM

## 2017-03-11 DIAGNOSIS — I10 Essential (primary) hypertension: Secondary | ICD-10-CM | POA: Insufficient documentation

## 2017-03-11 DIAGNOSIS — I7090 Unspecified atherosclerosis: Secondary | ICD-10-CM | POA: Diagnosis not present

## 2017-03-11 DIAGNOSIS — E785 Hyperlipidemia, unspecified: Secondary | ICD-10-CM | POA: Diagnosis not present

## 2017-03-11 NOTE — Assessment & Plan Note (Signed)
blood glucose control important in reducing the progression of atherosclerotic disease. Also, involved in wound healing. On appropriate medications.  

## 2017-03-11 NOTE — Assessment & Plan Note (Signed)
lipid control important in reducing the progression of atherosclerotic disease. Continue statin therapy  

## 2017-03-11 NOTE — Progress Notes (Signed)
MRN : [id number]  Bruce Bartlett is a 61 y.o. (18-Dec-1955) male who presents with chief complaint of  Chief Complaint  Patient presents with  . Follow-up  .  History of Present Illness: Patient returns today in follow up of PAD. He is over 5 years status post revascularization and toe amputation for limb threatening symptoms in 2012. He had a pacemaker placed earlier this year and this has helped his energy. He has no new or worrisome symptoms today. ABIs today are stable at 0.86 on the right and 0.88 on the left. Reasonably good biphasic waveforms. Digital pressures are good. Duplex shows no high-grade stenosis with several areas of mild to moderate stenosis which are stable.  Current Outpatient Prescriptions  Medication Sig Dispense Refill  . amLODipine (NORVASC) 10 MG tablet Take 1 tablet by mouth daily.    Marland Kitchen atorvastatin (LIPITOR) 40 MG tablet Take 1 tablet by mouth daily.    . brimonidine (ALPHAGAN) 0.2 % ophthalmic solution Place 1 drop into both eyes 2 (two) times daily.    . cephALEXin (KEFLEX) 250 MG capsule Take 1 capsule (250 mg total) by mouth 4 (four) times daily. 28 capsule 0  . dorzolamidel-timolol (COSOPT) 22.3-6.8 MG/ML SOLN ophthalmic solution Place 1 drop into both eyes at bedtime.    . gabapentin (NEURONTIN) 300 MG capsule Take 300 mg by mouth at bedtime.    Marland Kitchen HUMALOG KWIKPEN 100 UNIT/ML KiwkPen Inject 15 Units into the skin 2 (two) times daily.   0  . hydrALAZINE (APRESOLINE) 100 MG tablet Take 1 tablet (100 mg total) by mouth 3 (three) times daily. 30 tablet 0  . hydrochlorothiazide (HYDRODIURIL) 25 MG tablet Take 25 mg by mouth daily.    Marland Kitchen ibuprofen (ADVIL,MOTRIN) 800 MG tablet Take 800 mg by mouth every 6 (six) hours as needed.    . insulin NPH Human (HUMULIN N,NOVOLIN N) 100 UNIT/ML injection Inject 20 Units into the skin 2 (two) times daily before a meal.     . insulin regular (NOVOLIN R,HUMULIN R) 250 units/2.30mL (100 units/mL) injection Inject 15 Units into  the skin 2 (two) times daily before a meal.    . isosorbide mononitrate (IMDUR) 30 MG 24 hr tablet Take 1 tablet by mouth daily.    Marland Kitchen lactulose (CEPHULAC) 10 g packet Take 10 g by mouth 3 (three) times daily as needed.    . latanoprost (XALATAN) 0.005 % ophthalmic solution Place 1 drop into both eyes at bedtime.    Marland Kitchen losartan (COZAAR) 100 MG tablet Take 1 tablet by mouth daily.    . metoCLOPramide (REGLAN) 5 MG tablet Take 1 tablet (5 mg total) by mouth every 8 (eight) hours as needed for nausea or vomiting. 15 tablet 0  . omega-3 acid ethyl esters (LOVAZA) 1 g capsule Take 1 g by mouth daily.    . vitamin B-12 (CYANOCOBALAMIN) 1000 MCG tablet Take 1,000 mcg by mouth daily.     No current facility-administered medications for this visit.     Past Medical History:  Diagnosis Date  . Cardiomyopathy (HCC)   . Chronic kidney disease   . CKD (chronic kidney disease) stage 3, GFR 30-59 ml/min   . Diabetes mellitus without complication (HCC)   . Dyspnea   . Hyperlipidemia   . Hypertension   . Hypothyroidism   . Peripheral vascular disease (HCC)   . Stroke [facility])     Past Surgical History:  Procedure Laterality Date  . BACK SURGERY    .  PACEMAKER INSERTION N/A 10/31/2016   Procedure: INSERTION PACEMAKER;  Surgeon: Marcina Millard, MD;  Location: [facility] ORS;  Service: Cardiovascular;  Laterality: N/A;  . TOE AMPUTATION Right     Social History Social History  Substance Use Topics  . Smoking status: Former Games developer  . Smokeless tobacco: Never Used  . Alcohol use No     Family History Family History  Problem Relation Age of Onset  . Diabetes Mother   . Stroke Mother   . Cancer Father      No Known Allergies   REVIEW OF SYSTEMS (Negative unless checked)  Constitutional: [] Weight loss  [] Fever  [] Chills Cardiac: [] Chest pain   [] Chest pressure   [x] Palpitations   [] Shortness of breath when laying flat   [] Shortness of breath at rest   [x] Shortness of breath with  exertion. Vascular:  [] Pain in legs with walking   [] Pain in legs at rest   [] Pain in legs when laying flat   [] Claudication   [] Pain in feet when walking  [] Pain in feet at rest  [] Pain in feet when laying flat   [] History of DVT   [] Phlebitis   [x] Swelling in legs   [] Varicose veins   [] Non-healing ulcers Pulmonary:   [] Uses home oxygen   [] Productive cough   [] Hemoptysis   [] Wheeze  [] COPD   [] Asthma Neurologic:  [] Dizziness  [] Blackouts   [] Seizures   [] History of stroke   [] History of TIA  [] Aphasia   [] Temporary blindness   [] Dysphagia   [] Weakness or numbness in arms   [x] Weakness or numbness in legs Musculoskeletal:  [] Arthritis   [] Joint swelling   [] Joint pain   [] Low back pain Hematologic:  [] Easy bruising  [] Easy bleeding   [] Hypercoagulable state   [] Anemic   Gastrointestinal:  [] Blood in stool   [] Vomiting blood  [] Gastroesophageal reflux/heartburn   [] Abdominal pain Genitourinary:  [x] Chronic kidney disease   [] Difficult urination  [] Frequent urination  [] Burning with urination   [] Hematuria Skin:  [] Rashes   [] Ulcers   [] Wounds Psychological:  [] History of anxiety   []  History of major depression.  Physical Examination  BP (!) 156/78   Pulse 77   Resp 16   Wt 248 lb (112.5 kg)   BMI 32.28 kg/m  Gen:  WD/WN, NAD Head: [place]/AT, No temporalis wasting. Ear/Nose/Throat: Hearing grossly intact, nares w/o erythema or drainage, trachea midline Eyes: Conjunctiva clear. Sclera non-icteric Neck: Supple.  No JVD.  Pulmonary:  Good air movement, no use of accessory muscles.  Cardiac: Irregular Vascular:  Vessel Right Left  Radial Palpable Palpable                      Popliteal 1+ Palpable 1+ Palpable  PT 1+ Palpable 1+ Palpable  DP 1+ Palpable 2+ Palpable    Musculoskeletal: M/S 5/5 throughout.  No deformity or atrophy. 1+ right lower extremity edema. Walks with a cane Neurologic: Sensation grossly intact in extremities.  Symmetrical.  Speech is fluent.  Psychiatric:  Judgment intact, Mood & affect appropriate for pt's clinical situation. Dermatologic: No rashes or ulcers noted.  No cellulitis or open wounds.       Labs Recent Results (from the past 2160 hour(s))  VAS [place] LOWER EXTREMITY ARTERIAL DUPLEX     Status: None (In process)   Collection Time: 03/11/17 12:00 AM  Result Value Ref Range   Left super femoral prox sys PSV -176 cm/s   Left super femoral mid sys PSV 149 cm/s   Left  super femoral dist sys PSV -167 cm/s   Left popliteal prox sys PSV 94 cm/s   Left popliteal dist sys PSV 96 cm/s   Right super femoral prox sys PSV 164 cm/s   Right super femoral mid sys PSV -164 cm/s   Right super femoral dist sys PSV -108 cm/s   Right popliteal prox sys PSV 87 cm/s   Right popliteal dist sys PSV 88 cm/s   Right peroneal sys PSV -92 cm/s   Left ant tibial distal sys 77 cm/s   left post tibial dist sys 87 cm/s   LEFT PERO DIST SYS 105.00 cm/s   RIGHT ANT DIST TIBAL SYS PSV 85 cm/s   RIGHT POST TIB DIST SYS 77 cm/s    Radiology No results found.    Assessment/Plan  Hypertension blood pressure control important in reducing the progression of atherosclerotic disease. On appropriate oral medications.   Diabetes mellitus without complication (HCC) blood glucose control important in reducing the progression of atherosclerotic disease. Also, involved in wound healing. On appropriate medications.   Hyperlipidemia lipid control important in reducing the progression of atherosclerotic disease. Continue statin therapy   Peripheral vascular disease (HCC) ABIs today are stable at 0.86 on the right and 0.88 on the left. Reasonably good biphasic waveforms. Digital pressures are good. Duplex shows no high-grade stenosis with several areas of mild to moderate stenosis which are stable. No current lifestyle limiting symptoms or critical limb symptoms. Plan to recheck in 1 year. Continue current medical regimen.     [name],  MD  03/11/2017 12:23 PM    This note was created with Dragon medical transcription system.  Any errors from dictation are purely unintentional

## 2017-03-11 NOTE — Patient Instructions (Signed)

## 2017-03-11 NOTE — Assessment & Plan Note (Signed)
blood pressure control important in reducing the progression of atherosclerotic disease. On appropriate oral medications.  

## 2017-03-11 NOTE — Assessment & Plan Note (Signed)
ABIs today are stable at 0.86 on the right and 0.88 on the left. Reasonably good biphasic waveforms. Digital pressures are good. Duplex shows no high-grade stenosis with several areas of mild to moderate stenosis which are stable. No current lifestyle limiting symptoms or critical limb symptoms. Plan to recheck in 1 year. Continue current medical regimen.

## 2017-05-07 DIAGNOSIS — Z95810 Presence of automatic (implantable) cardiac defibrillator: Secondary | ICD-10-CM | POA: Insufficient documentation

## 2017-07-25 ENCOUNTER — Other Ambulatory Visit: Payer: Self-pay | Admitting: Internal Medicine

## 2017-07-25 DIAGNOSIS — R600 Localized edema: Secondary | ICD-10-CM

## 2017-07-30 ENCOUNTER — Ambulatory Visit
Admission: RE | Admit: 2017-07-30 | Discharge: 2017-07-30 | Disposition: A | Discharge status: Home / Self Care | Payer: Medicare Other | Source: Ambulatory Visit | Attending: Internal Medicine | Admitting: Internal Medicine

## 2017-07-30 DIAGNOSIS — R600 Localized edema: Secondary | ICD-10-CM | POA: Diagnosis present

## 2018-03-16 ENCOUNTER — Other Ambulatory Visit (INDEPENDENT_AMBULATORY_CARE_PROVIDER_SITE_OTHER): Payer: Self-pay | Admitting: Vascular Surgery

## 2018-03-16 DIAGNOSIS — I739 Peripheral vascular disease, unspecified: Secondary | ICD-10-CM

## 2018-03-17 ENCOUNTER — Ambulatory Visit (INDEPENDENT_AMBULATORY_CARE_PROVIDER_SITE_OTHER): Payer: Medicare Other

## 2018-03-17 ENCOUNTER — Ambulatory Visit (INDEPENDENT_AMBULATORY_CARE_PROVIDER_SITE_OTHER): Payer: Medicare Other | Admitting: Vascular Surgery

## 2018-03-17 ENCOUNTER — Encounter (INDEPENDENT_AMBULATORY_CARE_PROVIDER_SITE_OTHER): Payer: Self-pay | Admitting: Vascular Surgery

## 2018-03-17 VITALS — BP 143/72 | HR 73 | Resp 15 | Ht 73.5 in | Wt 255.0 lb

## 2018-03-17 DIAGNOSIS — I10 Essential (primary) hypertension: Secondary | ICD-10-CM

## 2018-03-17 DIAGNOSIS — E119 Type 2 diabetes mellitus without complications: Secondary | ICD-10-CM

## 2018-03-17 DIAGNOSIS — I739 Peripheral vascular disease, unspecified: Secondary | ICD-10-CM

## 2018-03-17 DIAGNOSIS — E785 Hyperlipidemia, unspecified: Secondary | ICD-10-CM | POA: Diagnosis not present

## 2018-03-17 NOTE — Assessment & Plan Note (Signed)
Noninvasive studies today showed no high-grade stenosis in either lower extremity with improved ABIs of 1.06 on the right and 1.00 on the left with good biphasic waveforms and normal digital pressures bilaterally. No current lifestyle limiting claudication, ischemic rest pain, or limb threatening symptoms.  No intervention recommended at current.  Continue current medical regimen and recheck in 1 year

## 2018-03-17 NOTE — Progress Notes (Signed)
MRN : [id number]  Bruce Bartlett is a 62 y.o. (1956-07-07) male who presents with chief complaint of  Chief Complaint  Patient presents with  . Follow-up    1 year ABI and Arterial  .  History of Present Illness: Patient returns today in follow up of PAD.  He has some neuropathy in both feet but no lifestyle limiting claudication, ischemic rest pain, or ulceration.  Noninvasive studies today showed no high-grade stenosis in either lower extremity with improved ABIs of 1.06 on the right and 1.00 on the left with good biphasic waveforms and normal digital pressures bilaterally.  Current Outpatient Medications  Medication Sig Dispense Refill  . amLODipine (NORVASC) 10 MG tablet Take 1 tablet by mouth daily.    Marland Kitchen atorvastatin (LIPITOR) 40 MG tablet Take 1 tablet by mouth daily.    . brimonidine (ALPHAGAN) 0.2 % ophthalmic solution Place 1 drop into both eyes 2 (two) times daily.    . dorzolamidel-timolol (COSOPT) 22.3-6.8 MG/ML SOLN ophthalmic solution Place 1 drop into both eyes at bedtime.    . gabapentin (NEURONTIN) 300 MG capsule Take 300 mg by mouth at bedtime.    Marland Kitchen HUMALOG KWIKPEN 100 UNIT/ML KiwkPen Inject 15 Units into the skin 2 (two) times daily.   0  . hydrALAZINE (APRESOLINE) 100 MG tablet Take 1 tablet (100 mg total) by mouth 3 (three) times daily. 30 tablet 0  . hydrochlorothiazide (HYDRODIURIL) 25 MG tablet Take 25 mg by mouth daily.    Marland Kitchen ibuprofen (ADVIL,MOTRIN) 800 MG tablet Take 800 mg by mouth every 6 (six) hours as needed.    . insulin NPH Human (HUMULIN N,NOVOLIN N) 100 UNIT/ML injection Inject 20 Units into the skin 2 (two) times daily before a meal.     . insulin regular (NOVOLIN R,HUMULIN R) 250 units/2.76mL (100 units/mL) injection Inject 15 Units into the skin 2 (two) times daily before a meal.    . isosorbide mononitrate (IMDUR) 30 MG 24 hr tablet Take 1 tablet by mouth daily.    Marland Kitchen lactulose (CEPHULAC) 10 g packet Take 10 g by mouth 3 (three) times daily as  needed.    . latanoprost (XALATAN) 0.005 % ophthalmic solution Place 1 drop into both eyes at bedtime.    Marland Kitchen levothyroxine (SYNTHROID, LEVOTHROID) 88 MCG tablet TAKE 1 TABLET BY MOUTH ONCE DAILY ON AN EMPTY STOMACH  WITH A GLASS OF WATER 30-60 MINUTES BEFORE BREAKFAST    . losartan (COZAAR) 100 MG tablet Take 1 tablet by mouth daily.    . metoCLOPramide (REGLAN) 5 MG tablet Take 1 tablet (5 mg total) by mouth every 8 (eight) hours as needed for nausea or vomiting. 15 tablet 0  . Multiple Vitamin (MULTI-VITAMINS) TABS Take by mouth.    . omega-3 acid ethyl esters (LOVAZA) 1 g capsule Take 1 g by mouth daily.    . vitamin B-12 (CYANOCOBALAMIN) 1000 MCG tablet Take 1,000 mcg by mouth daily.     No current facility-administered medications for this visit.     Past Medical History:  Diagnosis Date  . Cardiomyopathy (HCC)   . Chronic kidney disease   . CKD (chronic kidney disease) stage 3, GFR 30-59 ml/min (HCC)   . Diabetes mellitus without complication (HCC)   . Dyspnea   . Hyperlipidemia   . Hypertension   . Hypothyroidism   . Peripheral vascular disease (HCC)   . Stroke [facility])     Past Surgical History:  Procedure Laterality Date  . BACK SURGERY    .  PACEMAKER INSERTION N/A 10/31/2016   Procedure: INSERTION PACEMAKER;  Surgeon: Marcina MillardAlexander Paraschos, MD;  Location: [facility] ORS;  Service: Cardiovascular;  Laterality: N/A;  . TOE AMPUTATION Right     Social History  Substance Use Topics  . Smoking status: Former Games developermoker  . Smokeless tobacco: Never Used  . Alcohol use No     Family History      Family History  Problem Relation Age of Onset  . Diabetes Mother   . Stroke Mother   . Cancer Father      No Known Allergies   REVIEW OF SYSTEMS (Negative unless checked)  Constitutional: [] Weight loss  [] Fever  [] Chills Cardiac: [] Chest pain   [] Chest pressure   [x] Palpitations   [] Shortness of breath when laying flat   [] Shortness of breath at rest   [x] Shortness of  breath with exertion. Vascular:  [] Pain in legs with walking   [] Pain in legs at rest   [] Pain in legs when laying flat   [] Claudication   [] Pain in feet when walking  [] Pain in feet at rest  [] Pain in feet when laying flat   [] History of DVT   [] Phlebitis   [x] Swelling in legs   [] Varicose veins   [] Non-healing ulcers Pulmonary:   [] Uses home oxygen   [] Productive cough   [] Hemoptysis   [] Wheeze  [] COPD   [] Asthma Neurologic:  [] Dizziness  [] Blackouts   [] Seizures   [] History of stroke   [] History of TIA  [] Aphasia   [] Temporary blindness   [] Dysphagia   [] Weakness or numbness in arms   [x] Weakness or numbness in legs Musculoskeletal:  [] Arthritis   [] Joint swelling   [] Joint pain   [] Low back pain Hematologic:  [] Easy bruising  [] Easy bleeding   [] Hypercoagulable state   [] Anemic   Gastrointestinal:  [] Blood in stool   [] Vomiting blood  [] Gastroesophageal reflux/heartburn   [] Abdominal pain Genitourinary:  [x] Chronic kidney disease   [] Difficult urination  [] Frequent urination  [] Burning with urination   [] Hematuria Skin:  [] Rashes   [] Ulcers   [] Wounds Psychological:  [] History of anxiety   []  History of major depression.    Physical Examination  BP (!) 143/72 (BP Location: Left Arm, Patient Position: Sitting)   Pulse 73   Resp 15   Ht 6' 1.5" (1.867 m)   Wt 255 lb (115.7 kg)   BMI 33.19 kg/m  Gen:  WD/WN, NAD Head: [place]/AT, No temporalis wasting. Ear/Nose/Throat: Hearing grossly intact, nares w/o erythema or drainage Eyes: Conjunctiva clear. Sclera non-icteric Neck: Supple.  Trachea midline Pulmonary:  Good air movement, no use of accessory muscles.  Cardiac: RRR, no JVD Vascular:  Vessel Right Left  Radial Palpable Palpable                          PT 1+ Palpable 1+ Palpable  DP Palpable Palpable   Gastrointestinal: soft, non-tender/non-distended. No guarding/reflex.  Musculoskeletal: M/S 5/5 throughout.  No deformity or atrophy. 1+ RLE edema. Neurologic: Sensation  grossly intact in extremities.  Symmetrical.  Speech is fluent.  Psychiatric: Judgment intact, Mood & affect appropriate for pt's clinical situation. Dermatologic: No rashes or ulcers noted.  No cellulitis or open wounds.       Labs No results found for this or any previous visit (from the past 2160 hour(s)).  Radiology No results found.  Assessment/Plan Hypertension blood pressure control important in reducing the progression of atherosclerotic disease. On appropriate oral medications.   Diabetes mellitus without complication (HCC)  blood glucose control important in reducing the progression of atherosclerotic disease. Also, involved in wound healing. On appropriate medications.   Hyperlipidemia lipid control important in reducing the progression of atherosclerotic disease. Continue statin therapy  Peripheral vascular disease (HCC) Noninvasive studies today showed no high-grade stenosis in either lower extremity with improved ABIs of 1.06 on the right and 1.00 on the left with good biphasic waveforms and normal digital pressures bilaterally. No current lifestyle limiting claudication, ischemic rest pain, or limb threatening symptoms.  No intervention recommended at current.  Continue current medical regimen and recheck in 1 year    [name], MD  03/17/2018 11:30 AM    This note was created with Dragon medical transcription system.  Any errors from dictation are purely unintentional

## 2019-02-22 DIAGNOSIS — E1165 Type 2 diabetes mellitus with hyperglycemia: Secondary | ICD-10-CM | POA: Insufficient documentation

## 2019-03-19 ENCOUNTER — Ambulatory Visit (INDEPENDENT_AMBULATORY_CARE_PROVIDER_SITE_OTHER): Payer: Medicare Other | Admitting: Vascular Surgery

## 2019-03-19 ENCOUNTER — Encounter (INDEPENDENT_AMBULATORY_CARE_PROVIDER_SITE_OTHER): Payer: Medicare Other

## 2019-04-13 ENCOUNTER — Ambulatory Visit (INDEPENDENT_AMBULATORY_CARE_PROVIDER_SITE_OTHER): Payer: Medicare Other | Admitting: Vascular Surgery

## 2019-04-13 ENCOUNTER — Encounter (INDEPENDENT_AMBULATORY_CARE_PROVIDER_SITE_OTHER): Payer: Medicare Other

## 2019-05-04 ENCOUNTER — Encounter (INDEPENDENT_AMBULATORY_CARE_PROVIDER_SITE_OTHER): Payer: Medicare Other

## 2019-05-04 ENCOUNTER — Ambulatory Visit (INDEPENDENT_AMBULATORY_CARE_PROVIDER_SITE_OTHER): Payer: Medicare Other | Admitting: Vascular Surgery

## 2019-05-25 ENCOUNTER — Encounter (INDEPENDENT_AMBULATORY_CARE_PROVIDER_SITE_OTHER): Payer: Self-pay

## 2019-05-25 ENCOUNTER — Encounter (INDEPENDENT_AMBULATORY_CARE_PROVIDER_SITE_OTHER): Payer: Self-pay | Admitting: Vascular Surgery

## 2019-05-25 ENCOUNTER — Ambulatory Visit (INDEPENDENT_AMBULATORY_CARE_PROVIDER_SITE_OTHER): Payer: Medicare Other | Admitting: Vascular Surgery

## 2019-05-25 ENCOUNTER — Ambulatory Visit (INDEPENDENT_AMBULATORY_CARE_PROVIDER_SITE_OTHER): Payer: Medicare Other

## 2019-05-25 ENCOUNTER — Other Ambulatory Visit: Payer: Self-pay

## 2019-05-25 VITALS — BP 163/75 | HR 80 | Resp 16 | Wt 265.0 lb

## 2019-05-25 DIAGNOSIS — I739 Peripheral vascular disease, unspecified: Secondary | ICD-10-CM

## 2019-05-25 DIAGNOSIS — I10 Essential (primary) hypertension: Secondary | ICD-10-CM | POA: Diagnosis not present

## 2019-05-25 DIAGNOSIS — B029 Zoster without complications: Secondary | ICD-10-CM | POA: Insufficient documentation

## 2019-05-25 DIAGNOSIS — E119 Type 2 diabetes mellitus without complications: Secondary | ICD-10-CM

## 2019-05-25 DIAGNOSIS — N529 Male erectile dysfunction, unspecified: Secondary | ICD-10-CM | POA: Insufficient documentation

## 2019-05-25 DIAGNOSIS — Z8669 Personal history of other diseases of the nervous system and sense organs: Secondary | ICD-10-CM | POA: Insufficient documentation

## 2019-05-25 DIAGNOSIS — E785 Hyperlipidemia, unspecified: Secondary | ICD-10-CM

## 2019-05-25 DIAGNOSIS — IMO0002 Reserved for concepts with insufficient information to code with codable children: Secondary | ICD-10-CM | POA: Insufficient documentation

## 2019-05-25 NOTE — Assessment & Plan Note (Signed)
ABIs today are 0.9 bilaterally with biphasic waveforms and toe brachial indices of 0.6 bilaterally.  This is a slight drop from his study a year ago but his flow remains in the nearly normal range.  No role for intervention at this time.  He is now 8 years status post intervention.  Continue current medical regimen and recheck in 1 year

## 2019-05-25 NOTE — Progress Notes (Signed)
MRN : [id number]  Bruce Bartlett is a 63 y.o. (1955-10-28) male who presents with chief complaint of  Chief Complaint  Patient presents with  . Follow-up    ultrasound follow up  .  History of Present Illness: Patient returns today in follow up of his peripheral arterial disease.  He is about 8 years status post right lower extremity revascularization and great toe amputation.  He is doing well today.  He does have neuropathy in both legs but this is reasonably stable.  He has no new ulceration, infection, or gangrenous changes. ABIs today are 0.9 bilaterally with biphasic waveforms and toe brachial indices of 0.6 bilaterally.  This is a slight drop from his study a year ago but his flow remains in the nearly normal range.  Current Outpatient Medications  Medication Sig Dispense Refill  . amLODipine (NORVASC) 10 MG tablet Take 1 tablet by mouth daily.    Marland Kitchen atorvastatin (LIPITOR) 40 MG tablet Take 1 tablet by mouth daily.    . brimonidine (ALPHAGAN) 0.2 % ophthalmic solution Place 1 drop into both eyes 2 (two) times daily.    . dorzolamidel-timolol (COSOPT) 22.3-6.8 MG/ML SOLN ophthalmic solution Place 1 drop into both eyes at bedtime.    . gabapentin (NEURONTIN) 300 MG capsule Take 300 mg by mouth at bedtime.    Marland Kitchen HUMALOG KWIKPEN 100 UNIT/ML KiwkPen Inject 15 Units into the skin 2 (two) times daily.   0  . hydrALAZINE (APRESOLINE) 100 MG tablet Take 1 tablet (100 mg total) by mouth 3 (three) times daily. 30 tablet 0  . hydrochlorothiazide (HYDRODIURIL) 25 MG tablet Take 25 mg by mouth daily.    Marland Kitchen ibuprofen (ADVIL,MOTRIN) 800 MG tablet Take 800 mg by mouth every 6 (six) hours as needed.    . insulin NPH Human (HUMULIN N,NOVOLIN N) 100 UNIT/ML injection Inject 20 Units into the skin 2 (two) times daily before a meal.     . insulin regular (NOVOLIN R,HUMULIN R) 250 units/2.43mL (100 units/mL) injection Inject 15 Units into the skin 2 (two) times daily before a meal.    . isosorbide  mononitrate (IMDUR) 30 MG 24 hr tablet Take 1 tablet by mouth daily.    Marland Kitchen lactulose (CEPHULAC) 10 g packet Take 10 g by mouth 3 (three) times daily as needed.    . latanoprost (XALATAN) 0.005 % ophthalmic solution Place 1 drop into both eyes at bedtime.    Marland Kitchen levothyroxine (SYNTHROID, LEVOTHROID) 88 MCG tablet TAKE 1 TABLET BY MOUTH ONCE DAILY ON AN EMPTY STOMACH  WITH A GLASS OF WATER 30-60 MINUTES BEFORE BREAKFAST    . losartan (COZAAR) 100 MG tablet Take 1 tablet by mouth daily.    . metoCLOPramide (REGLAN) 5 MG tablet Take 1 tablet (5 mg total) by mouth every 8 (eight) hours as needed for nausea or vomiting. 15 tablet 0  . Multiple Vitamin (MULTI-VITAMINS) TABS Take by mouth.    . omega-3 acid ethyl esters (LOVAZA) 1 g capsule Take 1 g by mouth daily.    . vitamin B-12 (CYANOCOBALAMIN) 1000 MCG tablet Take 1,000 mcg by mouth daily.     No current facility-administered medications for this visit.     Past Medical History:  Diagnosis Date  . Cardiomyopathy ([place])   . Chronic kidney disease   . CKD (chronic kidney disease) stage 3, GFR 30-59 ml/min   . Diabetes mellitus without complication ([place])   . Dyspnea   . Hyperlipidemia   . Hypertension   .  Hypothyroidism   . Peripheral vascular disease ([facility])   . Stroke [facility])     Past Surgical History:  Procedure Laterality Date  . BACK SURGERY    . PACEMAKER INSERTION N/A 10/31/2016   Procedure: INSERTION PACEMAKER;  Surgeon: Marcina Millard, MD;  Location: [facility] ORS;  Service: Cardiovascular;  Laterality: N/A;  . TOE AMPUTATION Right     Social History  Substance Use Topics  . Smoking status: Former Games developer  . Smokeless tobacco: Never Used  . Alcohol use No     Family History      Family History  Problem Relation Age of Onset  . Diabetes Mother   . Stroke Mother   . Cancer Father      No Known Allergies   REVIEW OF SYSTEMS(Negative unless checked)  Constitutional: [] ?Weight loss[] ?Fever[] ?Chills  Cardiac:[] ?Chest pain[] ?Chest pressure[x] ?Palpitations [] ?Shortness of breath when laying flat [] ?Shortness of breath at rest [x] ?Shortness of breath with exertion. Vascular: [] ?Pain in legs with walking[] ?Pain in legsat rest[] ?Pain in legs when laying flat [] ?Claudication [] ?Pain in feet when walking [] ?Pain in feet at rest [] ?Pain in feet when laying flat [] ?History of DVT [] ?Phlebitis [x] ?Swelling in legs [] ?Varicose veins [] ?Non-healing ulcers Pulmonary: [] ?Uses home oxygen [] ?Productive cough[] ?Hemoptysis [] ?Wheeze [] ?COPD [] ?Asthma Neurologic: [] ?Dizziness [] ?Blackouts [] ?Seizures [] ?History of stroke [] ?History of TIA[] ?Aphasia [] ?Temporary blindness[] ?Dysphagia [] ?Weaknessor numbness in arms [x] ?Weakness or numbnessin legs Musculoskeletal: [] ?Arthritis [] ?Joint swelling [] ?Joint pain [] ?Low back pain Hematologic:[] ?Easy bruising[] ?Easy bleeding [] ?Hypercoagulable state [] ?Anemic  Gastrointestinal:[] ?Blood in stool[] ?Vomiting blood[] ?Gastroesophageal reflux/heartburn[] ?Abdominal pain Genitourinary: [x] ?Chronic kidney disease [] ?Difficulturination [] ?Frequenturination [] ?Burning with urination[] ?Hematuria Skin: [] ?Rashes [] ?Ulcers [] ?Wounds Psychological: [] ?History of anxiety[] ?History of major depression.    Physical Examination  BP (!) 163/75 (BP Location: Right Arm)   Pulse 80   Resp 16   Wt 265 lb (120.2 kg)   BMI 34.49 kg/m  Gen:  WD/WN, NAD Head: [place]/AT, No temporalis wasting. Ear/Nose/Throat: Hearing grossly intact, nares w/o erythema or drainage Eyes: Conjunctiva clear. Sclera non-icteric Neck: Supple.  Trachea midline Pulmonary:  Good air movement, no use of accessory muscles.  Cardiac: RRR, no JVD Vascular:  Vessel Right Left  Radial Palpable Palpable                          PT  1+ palpable  1+ palpable  DP Palpable Palpable   Gastrointestinal:  soft, non-tender/non-distended. No guarding/reflex.  Musculoskeletal: M/S 5/5 throughout.  No deformity or atrophy.  Mild bilateral lower extremity edema. Neurologic: Sensation grossly intact in extremities.  Symmetrical.  Speech is fluent.  Psychiatric: Judgment intact, Mood & affect appropriate for pt's clinical situation. Dermatologic: No rashes or ulcers noted.  No cellulitis or open wounds.       Labs No results found for this or any previous visit (from the past 2160 hour(s)).  Radiology No results found.  Assessment/Plan Hypertension blood pressure control important in reducing the progression of atherosclerotic disease. On appropriate oral medications.   Diabetes mellitus without complication (HCC) blood glucose control important in reducing the progression of atherosclerotic disease. Also, involved in wound healing. On appropriate medications.   Hyperlipidemia lipid control important in reducing the progression of atherosclerotic disease. Continue statin therapy  Peripheral vascular disease (HCC) ABIs today are 0.9 bilaterally with biphasic waveforms and toe brachial indices of 0.6 bilaterally.  This is a slight drop from his study a year ago but his flow remains in the nearly normal range.  No role for intervention at this time.  He is now 8 years status post intervention.  Continue current medical regimen and recheck in 1 year    Festus BarrenJason Franck Vinal, MD  05/25/2019 4:21 PM    This note was created with Dragon medical transcription system.  Any errors from dictation are purely unintentional

## 2019-07-07 ENCOUNTER — Other Ambulatory Visit: Payer: Self-pay | Admitting: Neurology

## 2019-07-07 DIAGNOSIS — H53453 Other localized visual field defect, bilateral: Secondary | ICD-10-CM

## 2019-07-18 ENCOUNTER — Ambulatory Visit: Payer: Medicare Other

## 2019-07-20 ENCOUNTER — Other Ambulatory Visit (HOSPITAL_COMMUNITY): Payer: Self-pay | Admitting: Neurology

## 2019-07-20 DIAGNOSIS — H53453 Other localized visual field defect, bilateral: Secondary | ICD-10-CM

## 2019-07-29 ENCOUNTER — Ambulatory Visit (HOSPITAL_COMMUNITY)
Admission: RE | Admit: 2019-07-29 | Discharge: 2019-07-29 | Disposition: A | Discharge status: Home / Self Care | Payer: Medicare Other | Source: Ambulatory Visit | Attending: Neurology | Admitting: Neurology

## 2019-07-29 ENCOUNTER — Other Ambulatory Visit: Payer: Self-pay

## 2019-07-29 ENCOUNTER — Encounter (HOSPITAL_COMMUNITY): Payer: Self-pay

## 2019-07-29 DIAGNOSIS — H53453 Other localized visual field defect, bilateral: Secondary | ICD-10-CM | POA: Diagnosis not present

## 2019-08-04 ENCOUNTER — Other Ambulatory Visit (HOSPITAL_COMMUNITY): Payer: Self-pay | Admitting: Neurology

## 2019-08-04 DIAGNOSIS — R9389 Abnormal findings on diagnostic imaging of other specified body structures: Secondary | ICD-10-CM

## 2019-08-18 ENCOUNTER — Other Ambulatory Visit: Payer: Self-pay

## 2019-08-18 ENCOUNTER — Ambulatory Visit (HOSPITAL_COMMUNITY)
Admission: RE | Admit: 2019-08-18 | Discharge: 2019-08-18 | Disposition: A | Discharge status: Home / Self Care | Payer: Medicare Other | Source: Ambulatory Visit | Attending: Neurology | Admitting: Neurology

## 2019-08-18 DIAGNOSIS — R9389 Abnormal findings on diagnostic imaging of other specified body structures: Secondary | ICD-10-CM | POA: Insufficient documentation

## 2019-08-18 NOTE — Progress Notes (Signed)
Informed of MRI for today.   Device system confirmed to be MRI conditional, with implant date > 6 weeks ago and no evidence of abandoned or epicardial leads in review of most recent CXR Interrogation from today reviewed, pt is currently BV paced at 76 bpm Change device settings for MRI to VOO at 90 bpm    Program device back to pre-MRI settings after completion of exam.  Graciella Freer, PA-C  08/18/2019 2:30 PM

## 2019-09-01 DIAGNOSIS — I10 Essential (primary) hypertension: Secondary | ICD-10-CM | POA: Insufficient documentation

## 2019-10-18 ENCOUNTER — Ambulatory Visit: Payer: Medicare Other | Attending: Internal Medicine

## 2019-10-18 ENCOUNTER — Other Ambulatory Visit: Payer: Self-pay

## 2019-10-18 DIAGNOSIS — Z23 Encounter for immunization: Secondary | ICD-10-CM

## 2019-10-18 NOTE — Progress Notes (Signed)
   Covid-19 Vaccination Clinic  Name:  MACHI WHITTAKER    MRN: [id number] DOB: August 06, 1955  10/18/2019  Mr. Rideaux was observed post Covid-19 immunization for 15 minutes without incident. He was provided with Vaccine Information Sheet and instruction to access the V-Safe system.   Mr. Fluke was instructed to call 911 with any severe reactions post vaccine: Marland Kitchen Difficulty breathing  . Swelling of face and throat  . A fast heartbeat  . A bad rash all over body  . Dizziness and weakness   Immunizations Administered    Name Date Dose VIS Date Route   Pfizer COVID-19 Vaccine 10/18/2019  9:50 AM 0.3 mL 07/02/2019 Intramuscular   Manufacturer: [employer], [employer]   Lot: [id number]   NDC: [address]-3

## 2019-11-08 DIAGNOSIS — G4733 Obstructive sleep apnea (adult) (pediatric): Secondary | ICD-10-CM | POA: Insufficient documentation

## 2019-11-08 DIAGNOSIS — I420 Dilated cardiomyopathy: Secondary | ICD-10-CM | POA: Insufficient documentation

## 2019-11-16 ENCOUNTER — Ambulatory Visit: Payer: Medicare Other | Attending: Internal Medicine

## 2019-11-16 DIAGNOSIS — Z23 Encounter for immunization: Secondary | ICD-10-CM

## 2019-11-16 NOTE — Progress Notes (Signed)
   Covid-19 Vaccination Clinic  Name:  MANVIR PRABHU    MRN: [id number] DOB: 06-17-1956  11/16/2019  Mr. Delange was observed post Covid-19 immunization for 15 minutes without incident. He was provided with Vaccine Information Sheet and instruction to access the V-Safe system.   Mr. Moure was instructed to call 911 with any severe reactions post vaccine: Marland Kitchen Difficulty breathing  . Swelling of face and throat  . A fast heartbeat  . A bad rash all over body  . Dizziness and weakness   Immunizations Administered    Name Date Dose VIS Date Route   Pfizer COVID-19 Vaccine 11/16/2019 10:18 AM 0.3 mL 09/15/2018 Intramuscular   Manufacturer: [employer], [employer]   Lot: [id number]   NDC: [address]-0

## 2019-11-30 DIAGNOSIS — E890 Postprocedural hypothyroidism: Secondary | ICD-10-CM | POA: Insufficient documentation

## 2020-05-30 ENCOUNTER — Ambulatory Visit (INDEPENDENT_AMBULATORY_CARE_PROVIDER_SITE_OTHER): Payer: Medicare Other | Admitting: Vascular Surgery

## 2020-05-30 ENCOUNTER — Other Ambulatory Visit: Payer: Self-pay

## 2020-05-30 ENCOUNTER — Ambulatory Visit (INDEPENDENT_AMBULATORY_CARE_PROVIDER_SITE_OTHER): Payer: Medicare Other

## 2020-05-30 ENCOUNTER — Encounter (INDEPENDENT_AMBULATORY_CARE_PROVIDER_SITE_OTHER): Payer: Self-pay | Admitting: Vascular Surgery

## 2020-05-30 VITALS — BP 156/78 | HR 83 | Resp 16 | Ht 74.0 in | Wt 264.0 lb

## 2020-05-30 DIAGNOSIS — E119 Type 2 diabetes mellitus without complications: Secondary | ICD-10-CM

## 2020-05-30 DIAGNOSIS — I10 Essential (primary) hypertension: Secondary | ICD-10-CM | POA: Diagnosis not present

## 2020-05-30 DIAGNOSIS — I739 Peripheral vascular disease, unspecified: Secondary | ICD-10-CM

## 2020-05-30 DIAGNOSIS — E785 Hyperlipidemia, unspecified: Secondary | ICD-10-CM | POA: Diagnosis not present

## 2020-05-30 NOTE — Assessment & Plan Note (Addendum)
His ABIs today have dropped to 0.76 on the right and 0.59 on the left although his waveforms remain strong and his digit pressure on the left remains in the normal range.  I would like to shorten his follow-up with these findings today.  I do not think he would benefit from intervention.  We will see him back in 6 months.  Continue current medical regimen.

## 2020-05-30 NOTE — Progress Notes (Signed)
MRN : [id number]  Bruce Bartlett is a 64 y.o. ([birth year]) male who presents with chief complaint of  Chief Complaint  Patient presents with  . Follow-up    ultrasound  .  History of Present Illness: Patient returns in follow-up of his peripheral arterial disease.  He has had a blister develop on the left leg which has been slow to heal.  He denied any trauma or injury.  He just kind of came up out of the blue.  He still has a small Band-Aid over the blister today.  It has gotten much better.  His legs are mildly painful and he is walking slower per his wife.  His ABIs today have dropped to 0.76 on the right and 0.59 on the left although his waveforms remain strong and his digit pressure on the left remains in the normal range.  Current Outpatient Medications  Medication Sig Dispense Refill  . amLODipine (NORVASC) 10 MG tablet Take 1 tablet by mouth daily.    Marland Kitchen atorvastatin (LIPITOR) 40 MG tablet Take 1 tablet by mouth daily.    . brimonidine (ALPHAGAN) 0.2 % ophthalmic solution Place 1 drop into both eyes 2 (two) times daily.    . dorzolamidel-timolol (COSOPT) 22.3-6.8 MG/ML SOLN ophthalmic solution Place 1 drop into both eyes at bedtime.    . gabapentin (NEURONTIN) 300 MG capsule Take 300 mg by mouth at bedtime.    Marland Kitchen HUMALOG KWIKPEN 100 UNIT/ML KiwkPen Inject 15 Units into the skin 2 (two) times daily.   0  . hydrALAZINE (APRESOLINE) 100 MG tablet Take 1 tablet (100 mg total) by mouth 3 (three) times daily. 30 tablet 0  . hydrochlorothiazide (HYDRODIURIL) 25 MG tablet Take 25 mg by mouth daily.    Marland Kitchen ibuprofen (ADVIL,MOTRIN) 800 MG tablet Take 800 mg by mouth every 6 (six) hours as needed.    . insulin NPH Human (HUMULIN N,NOVOLIN N) 100 UNIT/ML injection Inject 20 Units into the skin 2 (two) times daily before a meal.     . insulin regular (NOVOLIN R,HUMULIN R) 250 units/2.32mL (100 units/mL) injection Inject 15 Units into the skin 2 (two) times daily before a meal.    . isosorbide  mononitrate (IMDUR) 30 MG 24 hr tablet Take 1 tablet by mouth daily.    Marland Kitchen lactulose (CEPHULAC) 10 g packet Take 10 g by mouth 3 (three) times daily as needed.    . latanoprost (XALATAN) 0.005 % ophthalmic solution Place 1 drop into both eyes at bedtime.    Marland Kitchen levothyroxine (SYNTHROID, LEVOTHROID) 88 MCG tablet TAKE 1 TABLET BY MOUTH ONCE DAILY ON AN EMPTY STOMACH  WITH A GLASS OF WATER 30-60 MINUTES BEFORE BREAKFAST    . losartan (COZAAR) 100 MG tablet Take 1 tablet by mouth daily.    . metoCLOPramide (REGLAN) 5 MG tablet Take 1 tablet (5 mg total) by mouth every 8 (eight) hours as needed for nausea or vomiting. 15 tablet 0  . Multiple Vitamin (MULTI-VITAMINS) TABS Take by mouth.    . omega-3 acid ethyl esters (LOVAZA) 1 g capsule Take 1 g by mouth daily.    . pregabalin (LYRICA) 100 MG capsule Take by mouth.    . vitamin B-12 (CYANOCOBALAMIN) 1000 MCG tablet Take 1,000 mcg by mouth daily. (Patient not taking: Reported on 05/30/2020)     No current facility-administered medications for this visit.    Past Medical History:  Diagnosis Date  . Cardiomyopathy ([facility])   . Chronic kidney disease   .  CKD (chronic kidney disease) stage 3, GFR 30-59 ml/min ([facility])   . Diabetes mellitus without complication ([facility])   . Dyspnea   . Hyperlipidemia   . Hypertension   . Hypothyroidism   . Peripheral vascular disease ([facility])   . Stroke [facility])     Past Surgical History:  Procedure Laterality Date  . BACK SURGERY    . PACEMAKER INSERTION N/A 10/31/2016   Procedure: INSERTION PACEMAKER;  Surgeon: Marcina Millard, MD;  Location: [facility] ORS;  Service: Cardiovascular;  Laterality: N/A;  . TOE AMPUTATION Right      Social History   Tobacco Use  . Smoking status: Former Games developer  . Smokeless tobacco: Never Used  Substance Use Topics  . Alcohol use: No  . Drug use: No      Family History  Problem Relation Age of Onset  . Diabetes Mother   . Stroke Mother   . Cancer Father     No Known  Allergies    REVIEW OF SYSTEMS(Negative unless checked)  Constitutional: [] ??Weight loss[] ??Fever[] ??Chills Cardiac:[] ??Chest pain[] ??Chest pressure[x] ??Palpitations [] ??Shortness of breath when laying flat [] ??Shortness of breath at rest [x] ??Shortness of breath with exertion. Vascular: [] ??Pain in legs with walking[] ??Pain in legsat rest[] ??Pain in legs when laying flat [] ??Claudication [] ??Pain in feet when walking [] ??Pain in feet at rest [] ??Pain in feet when laying flat [] ??History of DVT [] ??Phlebitis [x] ??Swelling in legs [] ??Varicose veins [] ??Non-healing ulcers Pulmonary: [] ??Uses home oxygen [] ??Productive cough[] ??Hemoptysis [] ??Wheeze [] ??COPD [] ??Asthma Neurologic: [] ??Dizziness [] ??Blackouts [] ??Seizures [] ??History of stroke [] ??History of TIA[] ??Aphasia [] ??Temporary blindness[] ??Dysphagia [] ??Weaknessor numbness in arms [x] ??Weakness or numbnessin legs Musculoskeletal: [] ??Arthritis [] ??Joint swelling [] ??Joint pain [] ??Low back pain Hematologic:[] ??Easy bruising[] ??Easy bleeding [] ??Hypercoagulable state [] ??Anemic  Gastrointestinal:[] ??Blood in stool[] ??Vomiting blood[] ??Gastroesophageal reflux/heartburn[] ??Abdominal pain Genitourinary: [x] ??Chronic kidney disease [] ??Difficulturination [] ??Frequenturination [] ??Burning with urination[] ??Hematuria Skin: [] ??Rashes [x] ??Ulcers [x] ??Wounds Psychological: [] ??History of anxiety[] ??History of major depression.  Physical Examination  Vitals:   05/30/20 1415  BP: (!) 156/78  Pulse: 83  Resp: 16  Weight: 264 lb (119.7 kg)  Height: 6\' 2"  (1.88 m)   Body mass index is 33.9 kg/m. Gen:  WD/WN, NAD Head: [place]/AT, No temporalis wasting. Ear/Nose/Throat: Hearing grossly intact, nares w/o erythema or drainage, trachea midline Eyes: Conjunctiva clear. Sclera non-icteric Neck: Supple. No JVD Pulmonary:  Good  air movement, equal and clear to auscultation bilaterally.  Cardiac: RRR, No JVD Vascular:  Vessel Right Left  Radial Palpable Palpable                          PT  1+ palpable Palpable  DP  1+ palpable Palpable    Musculoskeletal: M/S 5/5 throughout.  No deformity or atrophy.  1+ right lower extremity edema, trace left lower extremity edema. Neurologic: CN 2-12 intact. Sensation grossly intact in extremities.  Symmetrical.  Speech is fluent. Motor exam as listed above. Psychiatric: Judgment intact, Mood & affect appropriate for pt's clinical situation. Dermatologic: Small blister present on the left anterior shin area covered with a Band-Aid.  No erythema or drainage.     CBC Lab Results  Component Value Date   WBC 6.8 10/29/2016   HGB 12.7 (L) 10/29/2016   HCT 40.5 10/29/2016   MCV 73.3 (L) 10/29/2016   PLT 199 10/29/2016    BMET    Component Value Date/Time   NA 141 10/29/2016 1412   K 4.1 10/29/2016 1412   CL 106 10/29/2016 1412   CO2 31 10/29/2016 1412   GLUCOSE 70 10/29/2016 1412   BUN 24 (H) 10/29/2016 1412  CREATININE 1.53 (H) 10/29/2016 1412   CALCIUM 9.2 10/29/2016 1412   GFRNONAA 47 (L) 10/29/2016 1412   GFRAA 55 (L) 10/29/2016 1412   CrCl cannot be calculated (Patient's most recent lab result is older than the maximum 21 days allowed.).  COAG Lab Results  Component Value Date   INR 1.24 10/29/2016   INR 1.23 05/19/2016    Radiology No results found.   Assessment/Plan Hypertension blood pressure control important in reducing the progression of atherosclerotic disease. On appropriate oral medications.   Diabetes mellitus without complication (HCC) blood glucose control important in reducing the progression of atherosclerotic disease. Also, involved in wound healing. On appropriate medications.   Hyperlipidemia lipid control important in reducing the progression of atherosclerotic disease. Continue statin therapy  Peripheral  vascular disease (HCC) His ABIs today have dropped to 0.76 on the right and 0.59 on the left although his waveforms remain strong and his digit pressure on the left remains in the normal range.  I would like to shorten his follow-up with these findings today.  I do not think he would benefit from intervention.  We will see him back in 6 months.  Continue current medical regimen.    [name], MD  05/30/2020 2:53 PM    This note was created with Dragon medical transcription system.  Any errors from dictation are purely unintentional

## 2020-10-10 ENCOUNTER — Other Ambulatory Visit: Payer: Self-pay | Admitting: Neurology

## 2020-10-10 DIAGNOSIS — I69351 Hemiplegia and hemiparesis following cerebral infarction affecting right dominant side: Secondary | ICD-10-CM

## 2020-10-27 ENCOUNTER — Ambulatory Visit
Admission: RE | Admit: 2020-10-27 | Discharge: 2020-10-27 | Disposition: A | Discharge status: Home / Self Care | Payer: Medicare Other | Source: Ambulatory Visit | Attending: Neurology | Admitting: Neurology

## 2020-10-27 ENCOUNTER — Other Ambulatory Visit: Payer: Self-pay

## 2020-10-27 DIAGNOSIS — I69351 Hemiplegia and hemiparesis following cerebral infarction affecting right dominant side: Secondary | ICD-10-CM | POA: Diagnosis present

## 2020-10-27 DIAGNOSIS — I6782 Cerebral ischemia: Secondary | ICD-10-CM | POA: Diagnosis not present

## 2020-12-05 ENCOUNTER — Other Ambulatory Visit: Payer: Self-pay

## 2020-12-05 ENCOUNTER — Ambulatory Visit (INDEPENDENT_AMBULATORY_CARE_PROVIDER_SITE_OTHER): Payer: Medicare Other | Admitting: Nurse Practitioner

## 2020-12-05 ENCOUNTER — Ambulatory Visit (INDEPENDENT_AMBULATORY_CARE_PROVIDER_SITE_OTHER): Payer: Medicare Other

## 2020-12-05 VITALS — BP 161/73 | HR 83 | Ht 74.0 in | Wt 261.0 lb

## 2020-12-05 DIAGNOSIS — I10 Essential (primary) hypertension: Secondary | ICD-10-CM

## 2020-12-05 DIAGNOSIS — I739 Peripheral vascular disease, unspecified: Secondary | ICD-10-CM | POA: Diagnosis not present

## 2020-12-05 DIAGNOSIS — E785 Hyperlipidemia, unspecified: Secondary | ICD-10-CM

## 2020-12-05 DIAGNOSIS — E119 Type 2 diabetes mellitus without complications: Secondary | ICD-10-CM

## 2020-12-11 ENCOUNTER — Encounter (INDEPENDENT_AMBULATORY_CARE_PROVIDER_SITE_OTHER): Payer: Self-pay | Admitting: Nurse Practitioner

## 2020-12-11 NOTE — Progress Notes (Signed)
Subjective:    Patient ID: Bruce Bartlett, male    DOB: 1955/09/02, 65 y.o.   MRN: [id number] Chief Complaint  Patient presents with  . Follow-up    6 Mo U/S    The patient returns to the office for followup and review of the noninvasive studies. There have been no interval changes in lower extremity symptoms. No interval shortening of the patient's claudication distance or development of rest pain symptoms. No new ulcers or wounds have occurred since the last visit.  There have been no significant changes to the patient's overall health care.  The patient denies amaurosis fugax or recent TIA symptoms. There are no recent neurological changes noted. The patient denies history of DVT, PE or superficial thrombophlebitis. The patient denies recent episodes of angina or shortness of breath.   ABI Rt=0.82 and Lt=0.86  (previous ABI's Rt=0.76 and Lt=0.59) Duplex ultrasound of the bilateral tibial arteries has biphasic waveforms.  There is no toe waveform on the right due to amputation but the left toe has good toe waveforms.   Review of Systems  Cardiovascular: Negative for leg swelling.  Skin: Negative for wound.  All other systems reviewed and are negative.      Objective:   Physical Exam Vitals reviewed.  HENT:     Head: Normocephalic.  Cardiovascular:     Rate and Rhythm: Normal rate.     Pulses: Decreased pulses.  Pulmonary:     Effort: Pulmonary effort is normal.  Neurological:     Mental Status: He is alert and oriented to person, place, and time.     Motor: Weakness present.     Gait: Gait abnormal.  Psychiatric:        Mood and Affect: Mood normal.        Behavior: Behavior normal.        Thought Content: Thought content normal.        Judgment: Judgment normal.     BP (!) 161/73   Pulse 83   Ht 6\' 2"  (1.88 m)   Wt 261 lb (118.4 kg)   BMI 33.51 kg/m   Past Medical History:  Diagnosis Date  . Cardiomyopathy (HCC)   . Chronic kidney disease   . CKD  (chronic kidney disease) stage 3, GFR 30-59 ml/min (HCC)   . Diabetes mellitus without complication (HCC)   . Dyspnea   . Hyperlipidemia   . Hypertension   . Hypothyroidism   . Peripheral vascular disease (HCC)   . Stroke [facility])     Social History   Socioeconomic History  . Marital status: Married    Spouse name: Not on file  . Number of children: Not on file  . Years of education: Not on file  . Highest education level: Not on file  Occupational History  . Not on file  Tobacco Use  . Smoking status: Former [facility]  . Smokeless tobacco: Never Used  Substance and Sexual Activity  . Alcohol use: No  . Drug use: No  . Sexual activity: Not on file  Other Topics Concern  . Not on file  Social History Narrative  . Not on file   Social Determinants of Health   Financial Resource Strain: Not on file  Food Insecurity: Not on file  Transportation Needs: Not on file  Physical Activity: Not on file  Stress: Not on file  Social Connections: Not on file  Intimate Partner Violence: Not on file    Past Surgical History:  Procedure Laterality  Date  . BACK SURGERY    . PACEMAKER INSERTION N/A 10/31/2016   Procedure: INSERTION PACEMAKER;  Surgeon: Marcina Millard, MD;  Location: [facility] ORS;  Service: Cardiovascular;  Laterality: N/A;  . TOE AMPUTATION Right     Family History  Problem Relation Age of Onset  . Diabetes Mother   . Stroke Mother   . Cancer Father     No Known Allergies  CBC Latest Ref Rng & Units 10/29/2016 05/19/2016 05/18/2016  WBC 3.8 - 10.6 K/uL 6.8 10.4 10.7(H)  Hemoglobin 13.0 - 18.0 g/dL 12.7(L) 14.1 14.3  Hematocrit 40.0 - 52.0 % 40.5 42.1 43.9  Platelets 150 - 440 K/uL 199 221 210      CMP     Component Value Date/Time   NA 141 10/29/2016 1412   K 4.1 10/29/2016 1412   CL 106 10/29/2016 1412   CO2 31 10/29/2016 1412   GLUCOSE 70 10/29/2016 1412   BUN 24 (H) 10/29/2016 1412   CREATININE 1.53 (H) 10/29/2016 1412   CALCIUM 9.2 10/29/2016  1412   GFRNONAA 47 (L) 10/29/2016 1412   GFRAA 55 (L) 10/29/2016 1412     No results found.     Assessment & Plan:   1. Peripheral vascular disease (HCC)  Recommend:  The patient has evidence of atherosclerosis of the lower extremities with claudication.  The patient does not voice lifestyle limiting changes at this point in time.  Noninvasive studies do not suggest clinically significant change.  No invasive studies, angiography or surgery at this time The patient should continue walking and begin a more formal exercise program.  The patient should continue antiplatelet therapy and aggressive treatment of the lipid abnormalities  No changes in the patient's medications at this time  The patient should continue wearing graduated compression socks 10-15 mmHg strength to control the mild edema.    2. Primary hypertension Continue antihypertensive medications as already ordered, these medications have been reviewed and there are no changes at this time.   3. Diabetes mellitus without complication (HCC) Continue hypoglycemic medications as already ordered, these medications have been reviewed and there are no changes at this time.  Hgb A1C to be monitored as already arranged by primary service   4. Hyperlipidemia, unspecified hyperlipidemia type Continue statin as ordered and reviewed, no changes at this time    Current Outpatient Medications on File Prior to Visit  Medication Sig Dispense Refill  . amLODipine (NORVASC) 10 MG tablet Take 1 tablet by mouth daily.    Marland Kitchen atorvastatin (LIPITOR) 40 MG tablet Take 1 tablet by mouth daily.    . brimonidine (ALPHAGAN) 0.2 % ophthalmic solution Place 1 drop into both eyes 2 (two) times daily.    . dorzolamidel-timolol (COSOPT) 22.3-6.8 MG/ML SOLN ophthalmic solution Place 1 drop into both eyes at bedtime.    . gabapentin (NEURONTIN) 300 MG capsule Take 300 mg by mouth at bedtime.    Marland Kitchen HUMALOG KWIKPEN 100 UNIT/ML KiwkPen Inject 15  Units into the skin 2 (two) times daily.   0  . hydrALAZINE (APRESOLINE) 100 MG tablet Take 1 tablet (100 mg total) by mouth 3 (three) times daily. 30 tablet 0  . hydrochlorothiazide (HYDRODIURIL) 25 MG tablet Take 25 mg by mouth daily.    Marland Kitchen ibuprofen (ADVIL,MOTRIN) 800 MG tablet Take 800 mg by mouth every 6 (six) hours as needed.    . insulin aspart (NOVOLOG) 100 UNIT/ML FlexPen Inject into the skin.    Marland Kitchen insulin degludec (TRESIBA FLEXTOUCH) 200 UNIT/ML  FlexTouch Pen Inject 55 units daily    . insulin NPH Human (HUMULIN N,NOVOLIN N) 100 UNIT/ML injection Inject 20 Units into the skin 2 (two) times daily before a meal.     . insulin regular (NOVOLIN R,HUMULIN R) 250 units/2.22mL (100 units/mL) injection Inject 15 Units into the skin 2 (two) times daily before a meal.    . isosorbide mononitrate (IMDUR) 30 MG 24 hr tablet Take 1 tablet by mouth daily.    Marland Kitchen lactulose (CEPHULAC) 10 g packet Take 10 g by mouth 3 (three) times daily as needed.    . latanoprost (XALATAN) 0.005 % ophthalmic solution Place 1 drop into both eyes at bedtime.    Marland Kitchen levothyroxine (SYNTHROID, LEVOTHROID) 88 MCG tablet TAKE 1 TABLET BY MOUTH ONCE DAILY ON AN EMPTY STOMACH  WITH A GLASS OF WATER 30-60 MINUTES BEFORE BREAKFAST    . losartan (COZAAR) 100 MG tablet Take 1 tablet by mouth daily.    . metoCLOPramide (REGLAN) 5 MG tablet Take 1 tablet (5 mg total) by mouth every 8 (eight) hours as needed for nausea or vomiting. 15 tablet 0  . Multiple Vitamin (MULTI-VITAMINS) TABS Take by mouth.    . omega-3 acid ethyl esters (LOVAZA) 1 g capsule Take 1 g by mouth daily.    . pregabalin (LYRICA) 100 MG capsule Take by mouth.    . torsemide (DEMADEX) 20 MG tablet Take by mouth.    . vitamin B-12 (CYANOCOBALAMIN) 1000 MCG tablet Take 1,000 mcg by mouth daily.     No current facility-administered medications on file prior to visit.    There are no Patient Instructions on file for this visit. No follow-ups on file.   Georgiana Spinner,  NP

## 2021-06-12 ENCOUNTER — Other Ambulatory Visit: Payer: Self-pay

## 2021-06-12 ENCOUNTER — Ambulatory Visit (INDEPENDENT_AMBULATORY_CARE_PROVIDER_SITE_OTHER): Payer: Medicare Other

## 2021-06-12 ENCOUNTER — Ambulatory Visit (INDEPENDENT_AMBULATORY_CARE_PROVIDER_SITE_OTHER): Payer: Medicare Other | Admitting: Vascular Surgery

## 2021-06-12 VITALS — BP 166/72 | HR 78 | Ht 73.0 in | Wt 258.0 lb

## 2021-06-12 DIAGNOSIS — I70219 Atherosclerosis of native arteries of extremities with intermittent claudication, unspecified extremity: Secondary | ICD-10-CM | POA: Insufficient documentation

## 2021-06-12 DIAGNOSIS — E785 Hyperlipidemia, unspecified: Secondary | ICD-10-CM | POA: Diagnosis not present

## 2021-06-12 DIAGNOSIS — E119 Type 2 diabetes mellitus without complications: Secondary | ICD-10-CM

## 2021-06-12 DIAGNOSIS — I739 Peripheral vascular disease, unspecified: Secondary | ICD-10-CM | POA: Diagnosis not present

## 2021-06-12 DIAGNOSIS — I10 Essential (primary) hypertension: Secondary | ICD-10-CM | POA: Diagnosis not present

## 2021-06-12 DIAGNOSIS — I70211 Atherosclerosis of native arteries of extremities with intermittent claudication, right leg: Secondary | ICD-10-CM

## 2021-06-12 NOTE — Progress Notes (Signed)
MRN : [id number]  Bruce Bartlett is a 65 y.o. (11-02-1955) male who presents with chief complaint of  Chief Complaint  Patient presents with   Follow-up    17mo ABI  .  History of Present Illness: Patient returns today in follow up of his PAD.  He is complaining of more right leg claudication that he was on previous visits.  No ulceration.  No rest pain.  He is also having some swelling on that side may be some arthritic knee issues as well.  His ABIs are stable at 0.96 on the right and 0.82 on the left with decent waveforms.  Current Outpatient Medications  Medication Sig Dispense Refill   amLODipine (NORVASC) 10 MG tablet Take 1 tablet by mouth daily.     atorvastatin (LIPITOR) 40 MG tablet Take 1 tablet by mouth daily.     brimonidine (ALPHAGAN) 0.2 % ophthalmic solution Place 1 drop into both eyes 2 (two) times daily.     dorzolamidel-timolol (COSOPT) 22.3-6.8 MG/ML SOLN ophthalmic solution Place 1 drop into both eyes at bedtime.     gabapentin (NEURONTIN) 300 MG capsule Take 300 mg by mouth at bedtime.     HUMALOG KWIKPEN 100 UNIT/ML KiwkPen Inject 15 Units into the skin 2 (two) times daily.   0   hydrALAZINE (APRESOLINE) 100 MG tablet Take 1 tablet (100 mg total) by mouth 3 (three) times daily. 30 tablet 0   hydrochlorothiazide (HYDRODIURIL) 25 MG tablet Take 25 mg by mouth daily.     ibuprofen (ADVIL,MOTRIN) 800 MG tablet Take 800 mg by mouth every 6 (six) hours as needed.     insulin aspart (NOVOLOG) 100 UNIT/ML FlexPen Inject into the skin.     insulin degludec (TRESIBA FLEXTOUCH) 200 UNIT/ML FlexTouch Pen Inject 55 units daily     insulin NPH Human (HUMULIN N,NOVOLIN N) 100 UNIT/ML injection Inject 20 Units into the skin 2 (two) times daily before a meal.      insulin regular (NOVOLIN R,HUMULIN R) 250 units/2.73mL (100 units/mL) injection Inject 15 Units into the skin 2 (two) times daily before a meal.     isosorbide mononitrate (IMDUR) 30 MG 24 hr tablet Take 1 tablet by  mouth daily.     lactulose (CEPHULAC) 10 g packet Take 10 g by mouth 3 (three) times daily as needed.     latanoprost (XALATAN) 0.005 % ophthalmic solution Place 1 drop into both eyes at bedtime.     levothyroxine (SYNTHROID, LEVOTHROID) 88 MCG tablet TAKE 1 TABLET BY MOUTH ONCE DAILY ON AN EMPTY STOMACH  WITH A GLASS OF WATER 30-60 MINUTES BEFORE BREAKFAST     losartan (COZAAR) 100 MG tablet Take 1 tablet by mouth daily.     metoCLOPramide (REGLAN) 5 MG tablet Take 1 tablet (5 mg total) by mouth every 8 (eight) hours as needed for nausea or vomiting. 15 tablet 0   Multiple Vitamin (MULTI-VITAMINS) TABS Take by mouth.     omega-3 acid ethyl esters (LOVAZA) 1 g capsule Take 1 g by mouth daily.     torsemide (DEMADEX) 20 MG tablet Take by mouth.     vitamin B-12 (CYANOCOBALAMIN) 1000 MCG tablet Take 1,000 mcg by mouth daily.     pregabalin (LYRICA) 100 MG capsule Take by mouth.     No current facility-administered medications for this visit.    Past Medical History:  Diagnosis Date   Cardiomyopathy (HCC)    Chronic kidney disease    CKD (chronic kidney disease)  stage 3, GFR 30-59 ml/min (HCC)    Diabetes mellitus without complication (HCC)    Dyspnea    Hyperlipidemia    Hypertension    Hypothyroidism    Peripheral vascular disease ([place])    Stroke [facility])     Past Surgical History:  Procedure Laterality Date   BACK SURGERY     PACEMAKER INSERTION N/A 10/31/2016   Procedure: INSERTION PACEMAKER;  Surgeon: Isaias Cowman, MD;  Location: [facility] ORS;  Service: Cardiovascular;  Laterality: N/A;   TOE AMPUTATION Right      Social History   Tobacco Use   Smoking status: Former   Smokeless tobacco: Never  Substance Use Topics   Alcohol use: No   Drug use: No      Family History  Problem Relation Age of Onset   Diabetes Mother    Stroke Mother    Cancer Father      No Known Allergies  REVIEW OF SYSTEMS (Negative unless checked)   Constitutional: [] Weight loss   [] Fever  [] Chills Cardiac: [] Chest pain   [] Chest pressure   [x] Palpitations   [] Shortness of breath when laying flat   [] Shortness of breath at rest   [x] Shortness of breath with exertion. Vascular:  [] Pain in legs with walking   [] Pain in legs at rest   [] Pain in legs when laying flat   [] Claudication   [] Pain in feet when walking  [] Pain in feet at rest  [] Pain in feet when laying flat   [] History of DVT   [] Phlebitis   [x] Swelling in legs   [] Varicose veins   [] Non-healing ulcers Pulmonary:   [] Uses home oxygen   [] Productive cough   [] Hemoptysis   [] Wheeze  [] COPD   [] Asthma Neurologic:  [] Dizziness  [] Blackouts   [] Seizures   [x] History of stroke   [] History of TIA  [] Aphasia   [] Temporary blindness   [] Dysphagia   [] Weakness or numbness in arms   [x] Weakness or numbness in legs Musculoskeletal:  [] Arthritis   [] Joint swelling   [] Joint pain   [] Low back pain Hematologic:  [] Easy bruising  [] Easy bleeding   [] Hypercoagulable state   [] Anemic   Gastrointestinal:  [] Blood in stool   [] Vomiting blood  [] Gastroesophageal reflux/heartburn   [] Abdominal pain Genitourinary:  [x] Chronic kidney disease   [] Difficult urination  [] Frequent urination  [] Burning with urination   [] Hematuria Skin:  [] Rashes   [x] Ulcers   [x] Wounds Psychological:  [] History of anxiety   []  History of major depression.    Physical Examination  BP (!) 166/72   Pulse 78   Ht 6\' 1"  (1.854 m)   Wt 258 lb (117 kg)   BMI 34.04 kg/m  Gen:  WD/WN, NAD Head: [place]/AT, No temporalis wasting. Ear/Nose/Throat: Hearing grossly intact, nares w/o erythema or drainage Eyes: Conjunctiva clear. Sclera non-icteric Neck: Supple.  Trachea midline Pulmonary:  Good air movement, no use of accessory muscles.  Cardiac: RRR, no JVD Vascular:  Vessel Right Left  Radial Palpable Palpable                          PT 1+ Palpable 1+ Palpable  DP 1+ Palpable 1+ Palpable   Gastrointestinal: soft, non-tender/non-distended. No  guarding/reflex.  Musculoskeletal: No deformity or atrophy. 1+ RLE edema. Neurologic: Sensation grossly intact in extremities.  Speech is fluent.  Psychiatric: Judgment intact, Mood & affect appropriate for pt's clinical situation. Dermatologic: No rashes or ulcers noted.  No cellulitis or open wounds.  Labs No results found for this or any previous visit (from the past 2160 hour(s)).  Radiology No results found.  Assessment/Plan Hypertension blood pressure control important in reducing the progression of atherosclerotic disease. On appropriate oral medications.     Diabetes mellitus without complication (HCC) blood glucose control important in reducing the progression of atherosclerotic disease. Also, involved in wound healing. On appropriate medications.     Hyperlipidemia lipid control important in reducing the progression of atherosclerotic disease. Continue statin therapy  Atherosclerosis of native arteries of extremity with intermittent claudication (HCC) His ABIs are stable at 0.96 on the right and 0.82 on the left with decent waveforms. His claudication symptoms are worse but hard to tell how much of this is neuropathy/arthritis.  We discussed angiogram versus a short interval follow-up and at this time, the patient would prefer short interval follow-up which would be agreement with his ABIs are stable and I think his symptoms are multifactorial although claudication may be a significant contributor.  Return in 3 months with ABIs    Leotis Pain, MD  06/12/2021 4:04 PM    This note was created with Dragon medical transcription system.  Any errors from dictation are purely unintentional

## 2021-06-12 NOTE — Assessment & Plan Note (Signed)
His ABIs are stable at 0.96 on the right and 0.82 on the left with decent waveforms. His claudication symptoms are worse but hard to tell how much of this is neuropathy/arthritis.  We discussed angiogram versus a short interval follow-up and at this time, the patient would prefer short interval follow-up which would be agreement with his ABIs are stable and I think his symptoms are multifactorial although claudication may be a significant contributor.  Return in 3 months with ABIs

## 2021-09-11 ENCOUNTER — Ambulatory Visit (INDEPENDENT_AMBULATORY_CARE_PROVIDER_SITE_OTHER): Payer: Medicare Other | Admitting: Vascular Surgery

## 2021-09-11 ENCOUNTER — Encounter (INDEPENDENT_AMBULATORY_CARE_PROVIDER_SITE_OTHER): Payer: Self-pay | Admitting: Vascular Surgery

## 2021-09-11 ENCOUNTER — Ambulatory Visit (INDEPENDENT_AMBULATORY_CARE_PROVIDER_SITE_OTHER): Payer: Medicare Other

## 2021-09-11 ENCOUNTER — Other Ambulatory Visit: Payer: Self-pay

## 2021-09-11 VITALS — BP 167/80 | HR 67 | Resp 15

## 2021-09-11 DIAGNOSIS — I10 Essential (primary) hypertension: Secondary | ICD-10-CM

## 2021-09-11 DIAGNOSIS — I70211 Atherosclerosis of native arteries of extremities with intermittent claudication, right leg: Secondary | ICD-10-CM | POA: Diagnosis not present

## 2021-09-11 DIAGNOSIS — E119 Type 2 diabetes mellitus without complications: Secondary | ICD-10-CM | POA: Diagnosis not present

## 2021-09-11 DIAGNOSIS — E785 Hyperlipidemia, unspecified: Secondary | ICD-10-CM | POA: Diagnosis not present

## 2021-09-11 NOTE — H&P (View-Only) (Signed)
MRN : [id number]  Bruce Bartlett is a 66 y.o. (May 06, 1956) male who presents with chief complaint of  Chief Complaint  Patient presents with   Follow-up    Ultrasound follow up  .  History of Present Illness: Patient returns today in follow up of his peripheral arterial disease.  He has lower extremity pain that is multifactorial, but his claudication symptoms that he describes have significantly worsened since his last visit 3 months ago.  Both legs are affected but he says his right leg hurts worse.  This is also the leg that was affected by the stroke.  He does have some scabs on both calves and ankle areas.  No chest pain or shortness of breath.  No fever or chills. ABIs today are slightly decreased at 0.91 on the right and 0.78 on the left.  Previously, these were 0.96 and 0.82.   Current Outpatient Medications  Medication Sig Dispense Refill   amLODipine (NORVASC) 5 MG tablet Take 5 tablets by mouth daily.     atorvastatin (LIPITOR) 40 MG tablet Take 1 tablet by mouth daily.     brimonidine (ALPHAGAN) 0.2 % ophthalmic solution Place 1 drop into both eyes 2 (two) times daily.     carvedilol (COREG) 3.125 MG tablet Take 3.125 mg by mouth 2 (two) times daily.     dorzolamidel-timolol (COSOPT) 22.3-6.8 MG/ML SOLN ophthalmic solution Place 1 drop into both eyes at bedtime.     gabapentin (NEURONTIN) 300 MG capsule Take 300 mg by mouth at bedtime.     hydrALAZINE (APRESOLINE) 100 MG tablet Take 1 tablet (100 mg total) by mouth 3 (three) times daily. 30 tablet 0   hydrochlorothiazide (HYDRODIURIL) 25 MG tablet Take 25 mg by mouth daily.     ibuprofen (ADVIL,MOTRIN) 800 MG tablet Take 800 mg by mouth every 6 (six) hours as needed.     insulin aspart (NOVOLOG) 100 UNIT/ML FlexPen Inject into the skin. 14 units lunch and 22 units at dinner     insulin degludec (TRESIBA FLEXTOUCH) 200 UNIT/ML FlexTouch Pen Inject 55 units daily     isosorbide mononitrate (IMDUR) 30 MG 24 hr tablet Take 1  tablet by mouth daily.     lactulose (CEPHULAC) 10 g packet Take 10 g by mouth 3 (three) times daily as needed.     latanoprost (XALATAN) 0.005 % ophthalmic solution Place 1 drop into both eyes at bedtime.     levothyroxine (SYNTHROID, LEVOTHROID) 88 MCG tablet TAKE 1 TABLET BY MOUTH ONCE DAILY ON AN EMPTY STOMACH  WITH A GLASS OF WATER 30-60 MINUTES BEFORE BREAKFAST     losartan (COZAAR) 100 MG tablet Take 1 tablet by mouth daily.     metoCLOPramide (REGLAN) 5 MG tablet Take 1 tablet (5 mg total) by mouth every 8 (eight) hours as needed for nausea or vomiting. 15 tablet 0   Multiple Vitamin (MULTI-VITAMINS) TABS Take by mouth.     omega-3 acid ethyl esters (LOVAZA) 1 g capsule Take 1 g by mouth daily.     tamsulosin (FLOMAX) 0.4 MG CAPS capsule Take 0.4 mg by mouth daily.     torsemide (DEMADEX) 20 MG tablet Take by mouth.     vitamin B-12 (CYANOCOBALAMIN) 1000 MCG tablet Take 1,000 mcg by mouth daily.     HUMALOG KWIKPEN 100 UNIT/ML KiwkPen Inject 15 Units into the skin 2 (two) times daily. 14 units in morning and 22 units at dinner (Patient not taking: Reported on 09/11/2021)  0  insulin NPH Human (HUMULIN N,NOVOLIN N) 100 UNIT/ML injection Inject 20 Units into the skin 2 (two) times daily before a meal.  (Patient not taking: Reported on 09/11/2021)     insulin regular (NOVOLIN R,HUMULIN R) 250 units/2.50mL (100 units/mL) injection Inject 15 Units into the skin 2 (two) times daily before a meal. (Patient not taking: Reported on 09/11/2021)     pregabalin (LYRICA) 100 MG capsule Take by mouth.     No current facility-administered medications for this visit.    Past Medical History:  Diagnosis Date   Cardiomyopathy ([place])    Chronic kidney disease    CKD (chronic kidney disease) stage 3, GFR 30-59 ml/min (HCC)    Diabetes mellitus without complication (HCC)    Dyspnea    Hyperlipidemia    Hypertension    Hypothyroidism    Peripheral vascular disease ([place])    Stroke ([place])     Past  Surgical History:  Procedure Laterality Date   BACK SURGERY     PACEMAKER INSERTION N/A 10/31/2016   Procedure: INSERTION PACEMAKER;  Surgeon: Isaias Cowman, MD;  Location: [facility] ORS;  Service: Cardiovascular;  Laterality: N/A;   TOE AMPUTATION Right      Social History   Tobacco Use   Smoking status: Former   Smokeless tobacco: Never  Substance Use Topics   Alcohol use: No   Drug use: No       Family History  Problem Relation Age of Onset   Diabetes Mother    Stroke Mother    Cancer Father      No Known Allergies  REVIEW OF SYSTEMS (Negative unless checked)   Constitutional: [] Weight loss  [] Fever  [] Chills Cardiac: [] Chest pain   [] Chest pressure   [x] Palpitations   [] Shortness of breath when laying flat   [] Shortness of breath at rest   [x] Shortness of breath with exertion. Vascular:  [] Pain in legs with walking   [] Pain in legs at rest   [] Pain in legs when laying flat   [] Claudication   [] Pain in feet when walking  [] Pain in feet at rest  [] Pain in feet when laying flat   [] History of DVT   [] Phlebitis   [x] Swelling in legs   [] Varicose veins   [] Non-healing ulcers Pulmonary:   [] Uses home oxygen   [] Productive cough   [] Hemoptysis   [] Wheeze  [] COPD   [] Asthma Neurologic:  [] Dizziness  [] Blackouts   [] Seizures   [x] History of stroke   [] History of TIA  [] Aphasia   [] Temporary blindness   [] Dysphagia   [] Weakness or numbness in arms   [x] Weakness or numbness in legs Musculoskeletal:  [] Arthritis   [] Joint swelling   [] Joint pain   [] Low back pain Hematologic:  [] Easy bruising  [] Easy bleeding   [] Hypercoagulable state   [] Anemic   Gastrointestinal:  [] Blood in stool   [] Vomiting blood  [] Gastroesophageal reflux/heartburn   [] Abdominal pain Genitourinary:  [x] Chronic kidney disease   [] Difficult urination  [] Frequent urination  [] Burning with urination   [] Hematuria Skin:  [] Rashes   [x] Ulcers   [x] Wounds Psychological:  [] History of anxiety   []  History of major  depression.  Physical Examination  BP (!) 167/80 (BP Location: Right Arm)    Pulse 67    Resp 15  Gen:  WD/WN, NAD Head: [place]/AT, No temporalis wasting. Ear/Nose/Throat: Hearing grossly intact, nares w/o erythema or drainage Eyes: Conjunctiva clear. Sclera non-icteric Neck: Supple.  Trachea midline Pulmonary:  Good air movement, no use of accessory muscles.  Cardiac:  RRR, no JVD Vascular:  Vessel Right Left  Radial Palpable Palpable                          PT Not palpable 1+ palpable  DP 1+ palpable 1+ palpable   Gastrointestinal: soft, non-tender/non-distended. No guarding/reflex.  Musculoskeletal: M/S 5/5 throughout.  No deformity or atrophy.  2+ right lower extremity edema, 1+ left lower extremity edema. Neurologic: Sensation grossly intact in extremities.  Symmetrical.  Speech is fluent.  Psychiatric: Judgment intact, Mood & affect appropriate for pt's clinical situation. Dermatologic: No rashes or ulcers noted.  No cellulitis or open wounds.      Labs No results found for this or any previous visit (from the past 2160 hour(s)).  Radiology No results found.  Assessment/Plan Hypertension blood pressure control important in reducing the progression of atherosclerotic disease. On appropriate oral medications.     Diabetes mellitus without complication (HCC) blood glucose control important in reducing the progression of atherosclerotic disease. Also, involved in wound healing. On appropriate medications.     Hyperlipidemia lipid control important in reducing the progression of atherosclerotic disease. Continue statin therapy  Atherosclerosis of native arteries of extremity with intermittent claudication (HCC) ABIs today are slightly decreased at 0.91 on the right and 0.78 on the left.  Previously, these were 0.96 and 0.82.  His leg pain symptoms particularly with activity have worsened.  It is difficult to discern how much of this is from neuropathic issues from  his previous stroke, how much of it is arthritic, and how much of it is from peripheral arterial disease.  I have discussed that any consideration for intervention is reasonable, but we must not expect complete resolution of his symptoms.  He and his wife are understanding of this but his symptoms have worsened to the point he is adamant that he wants to have intervention to see if he can improve his symptoms at least somewhat.  We will schedule him for right leg angio in the near future at his convenience, and if he has significant improvement we would then consider left leg angio in the future.  Risks and benefits were discussed.    Leotis Pain, MD  09/11/2021 12:25 PM    This note was created with Dragon medical transcription system.  Any errors from dictation are purely unintentional

## 2021-09-11 NOTE — Progress Notes (Signed)
MRN : [id number]  Bruce Bartlett is a 66 y.o. (1955/08/23) male who presents with chief complaint of  Chief Complaint  Patient presents with   Follow-up    Ultrasound follow up  .  History of Present Illness: Patient returns today in follow up of his peripheral arterial disease.  He has lower extremity pain that is multifactorial, but his claudication symptoms that he describes have significantly worsened since his last visit 3 months ago.  Both legs are affected but he says his right leg hurts worse.  This is also the leg that was affected by the stroke.  He does have some scabs on both calves and ankle areas.  No chest pain or shortness of breath.  No fever or chills. ABIs today are slightly decreased at 0.91 on the right and 0.78 on the left.  Previously, these were 0.96 and 0.82.   Current Outpatient Medications  Medication Sig Dispense Refill   amLODipine (NORVASC) 5 MG tablet Take 5 tablets by mouth daily.     atorvastatin (LIPITOR) 40 MG tablet Take 1 tablet by mouth daily.     brimonidine (ALPHAGAN) 0.2 % ophthalmic solution Place 1 drop into both eyes 2 (two) times daily.     carvedilol (COREG) 3.125 MG tablet Take 3.125 mg by mouth 2 (two) times daily.     dorzolamidel-timolol (COSOPT) 22.3-6.8 MG/ML SOLN ophthalmic solution Place 1 drop into both eyes at bedtime.     gabapentin (NEURONTIN) 300 MG capsule Take 300 mg by mouth at bedtime.     hydrALAZINE (APRESOLINE) 100 MG tablet Take 1 tablet (100 mg total) by mouth 3 (three) times daily. 30 tablet 0   hydrochlorothiazide (HYDRODIURIL) 25 MG tablet Take 25 mg by mouth daily.     ibuprofen (ADVIL,MOTRIN) 800 MG tablet Take 800 mg by mouth every 6 (six) hours as needed.     insulin aspart (NOVOLOG) 100 UNIT/ML FlexPen Inject into the skin. 14 units lunch and 22 units at dinner     insulin degludec (TRESIBA FLEXTOUCH) 200 UNIT/ML FlexTouch Pen Inject 55 units daily     isosorbide mononitrate (IMDUR) 30 MG 24 hr tablet Take 1  tablet by mouth daily.     lactulose (CEPHULAC) 10 g packet Take 10 g by mouth 3 (three) times daily as needed.     latanoprost (XALATAN) 0.005 % ophthalmic solution Place 1 drop into both eyes at bedtime.     levothyroxine (SYNTHROID, LEVOTHROID) 88 MCG tablet TAKE 1 TABLET BY MOUTH ONCE DAILY ON AN EMPTY STOMACH  WITH A GLASS OF WATER 30-60 MINUTES BEFORE BREAKFAST     losartan (COZAAR) 100 MG tablet Take 1 tablet by mouth daily.     metoCLOPramide (REGLAN) 5 MG tablet Take 1 tablet (5 mg total) by mouth every 8 (eight) hours as needed for nausea or vomiting. 15 tablet 0   Multiple Vitamin (MULTI-VITAMINS) TABS Take by mouth.     omega-3 acid ethyl esters (LOVAZA) 1 g capsule Take 1 g by mouth daily.     tamsulosin (FLOMAX) 0.4 MG CAPS capsule Take 0.4 mg by mouth daily.     torsemide (DEMADEX) 20 MG tablet Take by mouth.     vitamin B-12 (CYANOCOBALAMIN) 1000 MCG tablet Take 1,000 mcg by mouth daily.     HUMALOG KWIKPEN 100 UNIT/ML KiwkPen Inject 15 Units into the skin 2 (two) times daily. 14 units in morning and 22 units at dinner (Patient not taking: Reported on 09/11/2021)  0  insulin NPH Human (HUMULIN N,NOVOLIN N) 100 UNIT/ML injection Inject 20 Units into the skin 2 (two) times daily before a meal.  (Patient not taking: Reported on 09/11/2021)     insulin regular (NOVOLIN R,HUMULIN R) 250 units/2.31mL (100 units/mL) injection Inject 15 Units into the skin 2 (two) times daily before a meal. (Patient not taking: Reported on 09/11/2021)     pregabalin (LYRICA) 100 MG capsule Take by mouth.     No current facility-administered medications for this visit.    Past Medical History:  Diagnosis Date   Cardiomyopathy ([place])    Chronic kidney disease    CKD (chronic kidney disease) stage 3, GFR 30-59 ml/min (HCC)    Diabetes mellitus without complication (HCC)    Dyspnea    Hyperlipidemia    Hypertension    Hypothyroidism    Peripheral vascular disease ([place])    Stroke ([place])     Past  Surgical History:  Procedure Laterality Date   BACK SURGERY     PACEMAKER INSERTION N/A 10/31/2016   Procedure: INSERTION PACEMAKER;  Surgeon: Isaias Cowman, MD;  Location: [facility] ORS;  Service: Cardiovascular;  Laterality: N/A;   TOE AMPUTATION Right      Social History   Tobacco Use   Smoking status: Former   Smokeless tobacco: Never  Substance Use Topics   Alcohol use: No   Drug use: No       Family History  Problem Relation Age of Onset   Diabetes Mother    Stroke Mother    Cancer Father      No Known Allergies  REVIEW OF SYSTEMS (Negative unless checked)   Constitutional: [] Weight loss  [] Fever  [] Chills Cardiac: [] Chest pain   [] Chest pressure   [x] Palpitations   [] Shortness of breath when laying flat   [] Shortness of breath at rest   [x] Shortness of breath with exertion. Vascular:  [] Pain in legs with walking   [] Pain in legs at rest   [] Pain in legs when laying flat   [] Claudication   [] Pain in feet when walking  [] Pain in feet at rest  [] Pain in feet when laying flat   [] History of DVT   [] Phlebitis   [x] Swelling in legs   [] Varicose veins   [] Non-healing ulcers Pulmonary:   [] Uses home oxygen   [] Productive cough   [] Hemoptysis   [] Wheeze  [] COPD   [] Asthma Neurologic:  [] Dizziness  [] Blackouts   [] Seizures   [x] History of stroke   [] History of TIA  [] Aphasia   [] Temporary blindness   [] Dysphagia   [] Weakness or numbness in arms   [x] Weakness or numbness in legs Musculoskeletal:  [] Arthritis   [] Joint swelling   [] Joint pain   [] Low back pain Hematologic:  [] Easy bruising  [] Easy bleeding   [] Hypercoagulable state   [] Anemic   Gastrointestinal:  [] Blood in stool   [] Vomiting blood  [] Gastroesophageal reflux/heartburn   [] Abdominal pain Genitourinary:  [x] Chronic kidney disease   [] Difficult urination  [] Frequent urination  [] Burning with urination   [] Hematuria Skin:  [] Rashes   [x] Ulcers   [x] Wounds Psychological:  [] History of anxiety   []  History of major  depression.  Physical Examination  BP (!) 167/80 (BP Location: Right Arm)    Pulse 67    Resp 15  Gen:  WD/WN, NAD Head: [place]/AT, No temporalis wasting. Ear/Nose/Throat: Hearing grossly intact, nares w/o erythema or drainage Eyes: Conjunctiva clear. Sclera non-icteric Neck: Supple.  Trachea midline Pulmonary:  Good air movement, no use of accessory muscles.  Cardiac:  RRR, no JVD Vascular:  Vessel Right Left  Radial Palpable Palpable                          PT Not palpable 1+ palpable  DP 1+ palpable 1+ palpable   Gastrointestinal: soft, non-tender/non-distended. No guarding/reflex.  Musculoskeletal: M/S 5/5 throughout.  No deformity or atrophy.  2+ right lower extremity edema, 1+ left lower extremity edema. Neurologic: Sensation grossly intact in extremities.  Symmetrical.  Speech is fluent.  Psychiatric: Judgment intact, Mood & affect appropriate for pt's clinical situation. Dermatologic: No rashes or ulcers noted.  No cellulitis or open wounds.      Labs No results found for this or any previous visit (from the past 2160 hour(s)).  Radiology No results found.  Assessment/Plan Hypertension blood pressure control important in reducing the progression of atherosclerotic disease. On appropriate oral medications.     Diabetes mellitus without complication (HCC) blood glucose control important in reducing the progression of atherosclerotic disease. Also, involved in wound healing. On appropriate medications.     Hyperlipidemia lipid control important in reducing the progression of atherosclerotic disease. Continue statin therapy  Atherosclerosis of native arteries of extremity with intermittent claudication (HCC) ABIs today are slightly decreased at 0.91 on the right and 0.78 on the left.  Previously, these were 0.96 and 0.82.  His leg pain symptoms particularly with activity have worsened.  It is difficult to discern how much of this is from neuropathic issues from  his previous stroke, how much of it is arthritic, and how much of it is from peripheral arterial disease.  I have discussed that any consideration for intervention is reasonable, but we must not expect complete resolution of his symptoms.  He and his wife are understanding of this but his symptoms have worsened to the point he is adamant that he wants to have intervention to see if he can improve his symptoms at least somewhat.  We will schedule him for right leg angio in the near future at his convenience, and if he has significant improvement we would then consider left leg angio in the future.  Risks and benefits were discussed.    Leotis Pain, MD  09/11/2021 12:25 PM    This note was created with Dragon medical transcription system.  Any errors from dictation are purely unintentional

## 2021-09-11 NOTE — Assessment & Plan Note (Signed)
ABIs today are slightly decreased at 0.91 on the right and 0.78 on the left.  Previously, these were 0.96 and 0.82.  His leg pain symptoms particularly with activity have worsened.  It is difficult to discern how much of this is from neuropathic issues from his previous stroke, how much of it is arthritic, and how much of it is from peripheral arterial disease.  I have discussed that any consideration for intervention is reasonable, but we must not expect complete resolution of his symptoms.  He and his wife are understanding of this but his symptoms have worsened to the point he is adamant that he wants to have intervention to see if he can improve his symptoms at least somewhat.  We will schedule him for right leg angio in the near future at his convenience, and if he has significant improvement we would then consider left leg angio in the future.  Risks and benefits were discussed.

## 2021-09-13 ENCOUNTER — Telehealth (INDEPENDENT_AMBULATORY_CARE_PROVIDER_SITE_OTHER): Payer: Self-pay

## 2021-09-13 NOTE — Telephone Encounter (Signed)
I attempted to contact the patient to schedule him for a right leg angio and a message was left for a return call.

## 2021-09-14 NOTE — Telephone Encounter (Signed)
Patient and his spouse called back and he is scheduled with Dr. Wyn Quaker for a right leg angio on 09/20/21 with a 8:15 am arrival time to the [facility]. Pre-procedure instructions were discussed and will be mailed.

## 2021-09-18 ENCOUNTER — Telehealth (INDEPENDENT_AMBULATORY_CARE_PROVIDER_SITE_OTHER): Payer: Self-pay | Admitting: Nurse Practitioner

## 2021-09-18 NOTE — Telephone Encounter (Signed)
[place] called and wanted to know if Bruce Bartlett was authorized to have his surgery tomorrow.

## 2021-09-20 ENCOUNTER — Encounter
Admission: RE | Disposition: A | Discharge status: Home / Self Care | Payer: Self-pay | Source: Home / Self Care | Attending: Vascular Surgery

## 2021-09-20 ENCOUNTER — Other Ambulatory Visit (INDEPENDENT_AMBULATORY_CARE_PROVIDER_SITE_OTHER): Payer: Self-pay | Admitting: Nurse Practitioner

## 2021-09-20 ENCOUNTER — Encounter: Payer: Self-pay | Admitting: Vascular Surgery

## 2021-09-20 ENCOUNTER — Other Ambulatory Visit: Payer: Self-pay

## 2021-09-20 ENCOUNTER — Ambulatory Visit
Admission: RE | Admit: 2021-09-20 | Discharge: 2021-09-20 | Disposition: A | Discharge status: Home / Self Care | Payer: Medicare Other | Attending: Vascular Surgery | Admitting: Vascular Surgery

## 2021-09-20 DIAGNOSIS — E785 Hyperlipidemia, unspecified: Secondary | ICD-10-CM | POA: Diagnosis not present

## 2021-09-20 DIAGNOSIS — Z79899 Other long term (current) drug therapy: Secondary | ICD-10-CM | POA: Insufficient documentation

## 2021-09-20 DIAGNOSIS — I129 Hypertensive chronic kidney disease with stage 1 through stage 4 chronic kidney disease, or unspecified chronic kidney disease: Secondary | ICD-10-CM | POA: Insufficient documentation

## 2021-09-20 DIAGNOSIS — Z794 Long term (current) use of insulin: Secondary | ICD-10-CM | POA: Insufficient documentation

## 2021-09-20 DIAGNOSIS — I743 Embolism and thrombosis of arteries of the lower extremities: Secondary | ICD-10-CM | POA: Diagnosis not present

## 2021-09-20 DIAGNOSIS — Z8673 Personal history of transient ischemic attack (TIA), and cerebral infarction without residual deficits: Secondary | ICD-10-CM | POA: Diagnosis not present

## 2021-09-20 DIAGNOSIS — I429 Cardiomyopathy, unspecified: Secondary | ICD-10-CM | POA: Diagnosis not present

## 2021-09-20 DIAGNOSIS — E1122 Type 2 diabetes mellitus with diabetic chronic kidney disease: Secondary | ICD-10-CM | POA: Insufficient documentation

## 2021-09-20 DIAGNOSIS — I70213 Atherosclerosis of native arteries of extremities with intermittent claudication, bilateral legs: Secondary | ICD-10-CM | POA: Diagnosis not present

## 2021-09-20 DIAGNOSIS — N183 Chronic kidney disease, stage 3 unspecified: Secondary | ICD-10-CM | POA: Insufficient documentation

## 2021-09-20 DIAGNOSIS — E1151 Type 2 diabetes mellitus with diabetic peripheral angiopathy without gangrene: Secondary | ICD-10-CM | POA: Insufficient documentation

## 2021-09-20 DIAGNOSIS — I70221 Atherosclerosis of native arteries of extremities with rest pain, right leg: Secondary | ICD-10-CM

## 2021-09-20 DIAGNOSIS — I70211 Atherosclerosis of native arteries of extremities with intermittent claudication, right leg: Secondary | ICD-10-CM

## 2021-09-20 DIAGNOSIS — I70299 Other atherosclerosis of native arteries of extremities, unspecified extremity: Secondary | ICD-10-CM

## 2021-09-20 DIAGNOSIS — L97909 Non-pressure chronic ulcer of unspecified part of unspecified lower leg with unspecified severity: Secondary | ICD-10-CM

## 2021-09-20 HISTORY — PX: LOWER EXTREMITY ANGIOGRAPHY: CATH118251

## 2021-09-20 LAB — GLUCOSE, CAPILLARY
Glucose-Capillary: 148 mg/dL — ABNORMAL HIGH (ref 70–99)
Glucose-Capillary: 46 mg/dL — ABNORMAL LOW (ref 70–99)
Glucose-Capillary: 112 mg/dL — ABNORMAL HIGH (ref 70–99)
Glucose-Capillary: 82 mg/dL (ref 70–99)

## 2021-09-20 LAB — CREATININE, SERUM
Creatinine, Ser: 1.57 mg/dL — ABNORMAL HIGH (ref 0.61–1.24)
GFR, Estimated: 48 mL/min — ABNORMAL LOW (ref >60)

## 2021-09-20 LAB — BUN: BUN: 35 mg/dL — ABNORMAL HIGH (ref 8–23)

## 2021-09-20 SURGERY — LOWER EXTREMITY ANGIOGRAPHY
Anesthesia: Moderate Sedation | Site: Leg Lower | Laterality: Left

## 2021-09-20 MED ORDER — ACETAMINOPHEN 325 MG PO TABS: 650.0000 mg | ORAL_TABLET | ORAL | Status: DC | PRN

## 2021-09-20 MED ORDER — CLOPIDOGREL BISULFATE 75 MG PO TABS: 75.0000 mg | ORAL_TABLET | Freq: Every day | ORAL | 11 refills | Status: DC

## 2021-09-20 MED ORDER — MIDAZOLAM HCL 2 MG/ML PO SYRP: 8.0000 mg | ORAL_SOLUTION | Freq: Once | ORAL | Status: DC | PRN

## 2021-09-20 MED ORDER — CLOPIDOGREL BISULFATE 75 MG PO TABS
ORAL_TABLET | ORAL | Status: AC
  Filled 2021-09-20: qty 1

## 2021-09-20 MED ORDER — ONDANSETRON HCL 4 MG/2ML IJ SOLN
4.0000 mg | Freq: Four times a day (QID) | INTRAMUSCULAR | Status: DC | PRN
Start: 2021-09-20 — End: 2021-09-20

## 2021-09-20 MED ORDER — ONDANSETRON HCL 4 MG/2ML IJ SOLN: 4.0000 mg | Freq: Four times a day (QID) | INTRAMUSCULAR | Status: DC | PRN

## 2021-09-20 MED ORDER — FENTANYL CITRATE PF 50 MCG/ML IJ SOSY
PREFILLED_SYRINGE | INTRAMUSCULAR | Status: AC
  Filled 2021-09-20: qty 1

## 2021-09-20 MED ORDER — FAMOTIDINE 20 MG PO TABS: 40.0000 mg | ORAL_TABLET | Freq: Once | ORAL | Status: DC | PRN

## 2021-09-20 MED ORDER — DIPHENHYDRAMINE HCL 50 MG/ML IJ SOLN: 50.0000 mg | Freq: Once | INTRAMUSCULAR | Status: DC | PRN

## 2021-09-20 MED ORDER — HEPARIN SODIUM (PORCINE) 1000 UNIT/ML IJ SOLN
INTRAMUSCULAR | Status: DC | PRN
  Administered 2021-09-20: 5000 [IU] via INTRAVENOUS

## 2021-09-20 MED ORDER — MIDAZOLAM HCL 2 MG/2ML IJ SOLN
INTRAMUSCULAR | Status: AC
  Filled 2021-09-20: qty 2

## 2021-09-20 MED ORDER — NITROGLYCERIN 1 MG/10 ML FOR IR/CATH LAB
INTRA_ARTERIAL | Status: DC | PRN
  Administered 2021-09-20: 300 ug via INTRA_ARTERIAL

## 2021-09-20 MED ORDER — FENTANYL CITRATE (PF) 100 MCG/2ML IJ SOLN
INTRAMUSCULAR | Status: DC | PRN
  Administered 2021-09-20: 50 ug via INTRAVENOUS

## 2021-09-20 MED ORDER — HYDRALAZINE HCL 20 MG/ML IJ SOLN: 5.0000 mg | INTRAMUSCULAR | Status: DC | PRN

## 2021-09-20 MED ORDER — SODIUM CHLORIDE 0.9 % IV SOLN: 250.0000 mL | INTRAVENOUS | Status: DC | PRN

## 2021-09-20 MED ORDER — ASPIRIN EC 81 MG PO TBEC: 81.0000 mg | DELAYED_RELEASE_TABLET | Freq: Every day | ORAL | 2 refills | Status: DC

## 2021-09-20 MED ORDER — SODIUM CHLORIDE 0.9% FLUSH: 3.0000 mL | Freq: Two times a day (BID) | INTRAVENOUS | Status: DC

## 2021-09-20 MED ORDER — LABETALOL HCL 5 MG/ML IV SOLN: 10.0000 mg | INTRAVENOUS | Status: DC | PRN

## 2021-09-20 MED ORDER — HYDROMORPHONE HCL 1 MG/ML IJ SOLN: 1.0000 mg | Freq: Once | INTRAMUSCULAR | Status: DC | PRN

## 2021-09-20 MED ORDER — NITROGLYCERIN 1 MG/10 ML FOR IR/CATH LAB
INTRA_ARTERIAL | Status: AC
  Filled 2021-09-20: qty 10

## 2021-09-20 MED ORDER — ASPIRIN EC 81 MG PO TBEC: 81.0000 mg | DELAYED_RELEASE_TABLET | Freq: Every day | ORAL | Status: DC

## 2021-09-20 MED ORDER — METHYLPREDNISOLONE SODIUM SUCC 125 MG IJ SOLR: 125.0000 mg | Freq: Once | INTRAMUSCULAR | Status: DC | PRN

## 2021-09-20 MED ORDER — SODIUM CHLORIDE 0.9 % IV SOLN
INTRAVENOUS | Status: DC
Start: 2021-09-20 — End: 2021-09-20

## 2021-09-20 MED ORDER — DEXTROSE 50 % IV SOLN
1.0000 | Freq: Once | INTRAVENOUS | Status: AC
Start: 2021-09-20 — End: 2021-09-20
  Administered 2021-09-20: 50 mL via INTRAVENOUS

## 2021-09-20 MED ORDER — SODIUM CHLORIDE 0.9 % IV SOLN: INTRAVENOUS | Status: DC

## 2021-09-20 MED ORDER — CEFAZOLIN SODIUM-DEXTROSE 2-4 GM/100ML-% IV SOLN
2.0000 g | Freq: Once | INTRAVENOUS | Status: AC
  Administered 2021-09-20: 2 g via INTRAVENOUS

## 2021-09-20 MED ORDER — DEXTROSE 50 % IV SOLN
INTRAVENOUS | Status: AC
  Filled 2021-09-20: qty 50

## 2021-09-20 MED ORDER — MIDAZOLAM HCL 2 MG/2ML IJ SOLN
INTRAMUSCULAR | Status: DC | PRN
  Administered 2021-09-20: 2 mg via INTRAVENOUS

## 2021-09-20 MED ORDER — CLOPIDOGREL BISULFATE 75 MG PO TABS
75.0000 mg | ORAL_TABLET | Freq: Every day | ORAL | Status: DC
  Administered 2021-09-20: 75 mg via ORAL

## 2021-09-20 MED ORDER — SODIUM CHLORIDE 0.9% FLUSH: 3.0000 mL | INTRAVENOUS | Status: DC | PRN

## 2021-09-20 SURGICAL SUPPLY — 25 items
BALLN LUTONIX 018 4X150X130 (BALLOONS) ×2
BALLN LUTONIX 018 5X150X130 (BALLOONS) ×2
BALLN LUTONIX 018 5X300X130 (BALLOONS) ×2
BALLN ULTRVRSE 2.5X220X150 (BALLOONS) ×2
BALLOON LUTONIX 018 4X150X130 (BALLOONS) IMPLANT
BALLOON LUTONIX 018 5X150X130 (BALLOONS) IMPLANT
BALLOON LUTONIX 018 5X300X130 (BALLOONS) IMPLANT
BALLOON ULTRVRSE 2.5X220X150 (BALLOONS) IMPLANT
CATH ANGIO 5F PIGTAIL 65CM (CATHETERS) ×1 IMPLANT
CATH BEACON 5 .038 100 VERT TP (CATHETERS) ×1 IMPLANT
CATH NAVICROSS ANGLED 135CM (MICROCATHETER) ×1 IMPLANT
CATH ROTAREX 135 6FR (CATHETERS) ×1 IMPLANT
COVER PROBE U/S 5X48 (MISCELLANEOUS) ×1 IMPLANT
DEVICE STARCLOSE SE CLOSURE (Vascular Products) ×1 IMPLANT
GLIDEWIRE ADV .035X260CM (WIRE) ×1 IMPLANT
GUIDEWIRE PFTE-COATED .018X300 (WIRE) ×1 IMPLANT
KIT ENCORE 26 ADVANTAGE (KITS) ×1 IMPLANT
PACK ANGIOGRAPHY (CUSTOM PROCEDURE TRAY) ×2 IMPLANT
SHEATH ANL2 6FRX45 HC (SHEATH) ×1 IMPLANT
SHEATH BRITE TIP 5FRX11 (SHEATH) ×1 IMPLANT
STENT VIABAHN 6X25X120 (Permanent Stent) ×1 IMPLANT
SYR MEDRAD MARK 7 150ML (SYRINGE) ×1 IMPLANT
TUBING CONTRAST HIGH PRESS 72 (TUBING) ×1 IMPLANT
WIRE G V18X300CM (WIRE) ×1 IMPLANT
WIRE GUIDERIGHT .035X150 (WIRE) ×1 IMPLANT

## 2021-09-20 NOTE — Op Note (Signed)
[facility] ? Percutaneous Study/Intervention Procedural Note ? ? ?Date of Surgery: 09/20/2021 ? ?Surgeon(s):Sweta Halseth   ? ?Assistants:none ? ?Pre-operative Diagnosis: PAD with claudication bilateral lower extremities ? ?Post-operative diagnosis:  Same ? ?Procedure(s) Performed: ?            1.  Ultrasound guidance for vascular access left femoral artery ?            2.  Catheter placement into right common femoral artery from left femoral approach ?            3.  Aortogram and selective right lower extremity angiogram ?            4.  Percutaneous transluminal angioplasty of right peroneal artery and tibioperoneal trunk with 2.5 mm diameter conventional and 4 mm diameter Lutonix drug-coated angioplasty balloon ?            5.  Mechanical thrombectomy of the right SFA and popliteal arteries with the [place] Rex device ? 6.  Percutaneous transluminal angioplasty of the right SFA and popliteal arteries with 5 mm diameter by 30 cm length Lutonix drug-coated angioplasty balloon ? 7.  Viabahn stent placement of the right popliteal artery with 6 mm diameter by 2.5 cm length stent for residual stenosis after angioplasty ?            8.  StarClose closure device left femoral artery ? ?EBL: 50 cc ? ?Contrast: 70 cc ? ?Fluoro Time: 8.2 minutes ? ?Moderate Conscious Sedation Time: approximately 44 minutes using 2 mg of Versed and 50 mcg of Fentanyl ?             ?Indications:  Patient is a 66 y.o.male with peripheral arterial disease and significant pain in the lower extremity.  He has significant claudication symptoms.  The patient has noninvasive study showing somewhat reduced ABIs bilaterally. The patient is brought in for angiography for further evaluation and potential treatment.  Risks and benefits are discussed and informed consent is obtained.  ? ?Procedure:  The patient was identified and appropriate procedural time out was performed.  The patient was then placed supine on the table and prepped  and draped in the usual sterile fashion. Moderate conscious sedation was administered during a face to face encounter with the patient throughout the procedure with my supervision of the RN administering medicines and monitoring the patient's vital signs, pulse oximetry, telemetry and mental status throughout from the start of the procedure until the patient was taken to the recovery room. Ultrasound was used to evaluate the left common femoral artery.  It was patent .  A digital ultrasound image was acquired.  A Seldinger needle was used to access the left common femoral artery under direct ultrasound guidance and a permanent image was performed.  A 0.035 J wire was advanced without resistance and a 5Fr sheath was placed.  Pigtail catheter was placed into the aorta and an AP aortogram was performed. This demonstrated normal renal arteries and normal aorta and iliac segments without significant stenosis. I then crossed the aortic bifurcation and advanced to the right femoral head. Selective right lower extremity angiogram was then performed. This demonstrated fairly normal common femoral artery and profunda femoris artery.  The proximal SFA had mild stenosis of about 30 to 40%.  The mid to distal SFA had about an 80% stenosis and the mid popliteal artery had about a 70 to 80% stenosis.  The tibioperoneal trunk was occluded with reconstitution of the posterior tibial  and peroneal arteries distally with the peroneal artery being larger.  The anterior tibial artery was chronically occluded without distal reconstitution seen. It was felt that it was in the patient's best interest to proceed with intervention after these images to avoid a second procedure and a larger amount of contrast and fluoroscopy based off of the findings from the initial angiogram. The patient was systemically heparinized and a 6 [place] Ansell sheath was then placed over the [employer] wire. I then used a Kumpe catheter and the advantage  wire to navigate through the SFA and popliteal lesions and down into the tibioperoneal trunk occlusion.  I was able to cross the occlusion with a 0.018 advantage wire and a Nava cross catheter and confirm intraluminal flow in the mid peroneal artery.  I then replaced a V18 wire.  A 2.5 mm diameter by 22 cm length angioplasty balloon was inflated from the mid peroneal artery up to the tibioperoneal trunk and taken up to 12 atm for 1 minute.  I did mechanical thrombectomy of the right SFA and popliteal lesions to debulk the chronic thrombus using the [place] Rex device and 2 passes were made for mechanical thrombectomy in these areas.  I then treated the SFA and popliteal arteries with 5 mm diameter by 30 cm length Lutonix drug-coated angioplasty balloon inflated to 12 atm for 1 minute.  Completion imaging showed only about a 30% residual stenosis in the SFA, but a greater than 50% residual stenosis focally in the mid popliteal artery.  There also remained a high-grade residual stenosis in the tibioperoneal trunk and proximal peroneal artery.  For the popliteal lesion, 6 mm diameter by 2.5 cm length Viabahn stent was deployed and postdilated with a 5 mm balloon with excellent angiographic completion result in about 10% residual stenosis.  For the tibioperoneal trunk and proximal peroneal artery, a 4 mm diameter by 15 cm length Lutonix drug-coated angioplasty balloon was inflated to 6 atm for 1 minute.  Completion imaging showed markedly improved flow distally although there was some vasospasm that was treated with intra-arterial nitroglycerin.  There appeared to be less than 30% residual stenosis in the tibioperoneal trunk and proximal peroneal artery. I elected to terminate the procedure. The sheath was removed and StarClose closure device was deployed in the left femoral artery with excellent hemostatic result. The patient was taken to the recovery room in stable condition having tolerated the procedure  well. ? ?Findings:   ?            Aortogram: Normal renal arteries, normal aorta and iliac arteries which required oblique projections to see around the spinal hardware ?            Right lower Extremity: Fairly normal common femoral artery and profunda femoris artery.  The proximal SFA had mild stenosis of about 30 to 40%.  The mid to distal SFA had about an 80% stenosis and the mid popliteal artery had about a 70 to 80% stenosis.  The tibioperoneal trunk was occluded with reconstitution of the posterior tibial and peroneal arteries distally with the peroneal artery being larger.  The anterior tibial artery was chronically occluded without distal reconstitution seen. ? ? ?Disposition: Patient was taken to the recovery room in stable condition having tolerated the procedure well. ? ?Complications: None ? ?Leotis Pain ?09/20/2021 ?10:31 AM ? ? ?This note was created with Dragon Medical transcription system. Any errors in dictation are purely unintentional.  ?

## 2021-09-20 NOTE — Interval H&P Note (Signed)
History and Physical Interval Note: ? ?09/20/2021 ?8:15 AM ? ?Bruce Bartlett  has presented today for surgery, with the diagnosis of RLE Angio    ASO w ulceration.  The various methods of treatment have been discussed with the patient and family. After consideration of risks, benefits and other options for treatment, the patient has consented to  Procedure(s): ?Lower Extremity Angiography (Left) as a surgical intervention.  The patient's history has been reviewed, patient examined, no change in status, stable for surgery.  I have reviewed the patient's chart and labs.  Questions were answered to the patient's satisfaction.   ? ? ?[name] ? ? ?

## 2021-09-21 ENCOUNTER — Encounter: Payer: Self-pay | Admitting: Vascular Surgery

## 2021-10-16 ENCOUNTER — Other Ambulatory Visit (INDEPENDENT_AMBULATORY_CARE_PROVIDER_SITE_OTHER): Payer: Self-pay | Admitting: Vascular Surgery

## 2021-10-16 DIAGNOSIS — Z95820 Peripheral vascular angioplasty status with implants and grafts: Secondary | ICD-10-CM

## 2021-10-16 DIAGNOSIS — I70213 Atherosclerosis of native arteries of extremities with intermittent claudication, bilateral legs: Secondary | ICD-10-CM

## 2021-10-17 ENCOUNTER — Ambulatory Visit (INDEPENDENT_AMBULATORY_CARE_PROVIDER_SITE_OTHER): Payer: Medicare Other | Admitting: Nurse Practitioner

## 2021-10-17 ENCOUNTER — Other Ambulatory Visit: Payer: Self-pay

## 2021-10-17 ENCOUNTER — Ambulatory Visit (INDEPENDENT_AMBULATORY_CARE_PROVIDER_SITE_OTHER): Payer: Medicare Other

## 2021-10-17 ENCOUNTER — Encounter (INDEPENDENT_AMBULATORY_CARE_PROVIDER_SITE_OTHER): Payer: Self-pay | Admitting: Nurse Practitioner

## 2021-10-17 VITALS — BP 140/67 | HR 78 | Resp 16 | Ht 73.5 in | Wt 271.0 lb

## 2021-10-17 DIAGNOSIS — I70213 Atherosclerosis of native arteries of extremities with intermittent claudication, bilateral legs: Secondary | ICD-10-CM | POA: Diagnosis not present

## 2021-10-17 DIAGNOSIS — E119 Type 2 diabetes mellitus without complications: Secondary | ICD-10-CM

## 2021-10-17 DIAGNOSIS — Z95820 Peripheral vascular angioplasty status with implants and grafts: Secondary | ICD-10-CM

## 2021-10-17 DIAGNOSIS — I70211 Atherosclerosis of native arteries of extremities with intermittent claudication, right leg: Secondary | ICD-10-CM | POA: Diagnosis not present

## 2021-10-17 DIAGNOSIS — E785 Hyperlipidemia, unspecified: Secondary | ICD-10-CM

## 2021-10-17 DIAGNOSIS — I10 Essential (primary) hypertension: Secondary | ICD-10-CM

## 2021-10-28 ENCOUNTER — Encounter (INDEPENDENT_AMBULATORY_CARE_PROVIDER_SITE_OTHER): Payer: Self-pay | Admitting: Nurse Practitioner

## 2021-10-28 NOTE — Progress Notes (Signed)
? ?Subjective:  ? ? Patient ID: Bruce Bartlett, male    DOB: Feb 28, 1956, 66 y.o.   MRN: [id number] ?Chief Complaint  ?Patient presents with  ? Follow-up  ?  Ultrasound  ? ? ?The patient returns to the office for followup and review status post angiogram with intervention. The patient notes improvement in the lower extremity symptoms. No interval shortening of the patient's claudication distance or rest pain symptoms.  The patient notes that he has less falls less of the neuropathic pains that were bothering him previously. ? ?There have been no significant changes to the patient's overall health care. ? ?The patient denies amaurosis fugax or recent TIA symptoms. There are no recent neurological changes noted. ?The patient denies history of DVT, PE or superficial thrombophlebitis. ?The patient denies recent episodes of angina or shortness of breath.  ? ?ABI's Rt=1.15 and Lt=0.96  (previous ABI's Rt=0.91 and Lt=0.78) ?Duplex US of the bilateral lower extremities reveals biphasic waveforms with good toe waveforms on the left lower extremity ? ? ?Review of Systems  ?Musculoskeletal:  Positive for gait problem.  ?Skin:  Negative for wound.  ?All other systems reviewed and are negative. ? ?   ?Objective:  ? Physical Exam ?Vitals reviewed.  ?HENT:  ?   Head: Normocephalic.  ?Cardiovascular:  ?   Rate and Rhythm: Normal rate.  ?   Pulses: Decreased pulses.  ?Pulmonary:  ?   Effort: Pulmonary effort is normal.  ?Skin: ?   General: Skin is warm and dry.  ?Neurological:  ?   Mental Status: He is alert and oriented to person, place, and time.  ?Psychiatric:     ?   Mood and Affect: Mood normal.     ?   Behavior: Behavior normal.     ?   Thought Content: Thought content normal.     ?   Judgment: Judgment normal.  ? ? ?BP 140/67 (BP Location: Right Arm)   Pulse 78   Resp 16   Ht 6' 1.5" (1.867 m)   Wt 271 lb (122.9 kg)   BMI 35.27 kg/m?  ? ?Past Medical History:  ?Diagnosis Date  ? Cardiomyopathy ([place])   ? Chronic kidney  disease   ? CKD (chronic kidney disease) stage 3, GFR 30-59 ml/min (HCC)   ? Diabetes mellitus without complication ([place])   ? Dyspnea   ? Hyperlipidemia   ? Hypertension   ? Hypothyroidism   ? Peripheral vascular disease ([place])   ? Stroke [facility])   ? ? ?Social History  ? ?Socioeconomic History  ? Marital status: Married  ?  Spouse name: Not on file  ? Number of children: Not on file  ? Years of education: Not on file  ? Highest education level: Not on file  ?Occupational History  ? Not on file  ?Tobacco Use  ? Smoking status: Former  ? Smokeless tobacco: Never  ?Substance and Sexual Activity  ? Alcohol use: No  ? Drug use: No  ? Sexual activity: Not on file  ?Other Topics Concern  ? Not on file  ?Social History Narrative  ? Not on file  ? ?Social Determinants of Health  ? ?Financial Resource Strain: Not on file  ?Food Insecurity: Not on file  ?Transportation Needs: Not on file  ?Physical Activity: Not on file  ?Stress: Not on file  ?Social Connections: Not on file  ?Intimate Partner Violence: Not on file  ? ? ?Past Surgical History:  ?Procedure Laterality Date  ? BACK SURGERY    ?  LOWER EXTREMITY ANGIOGRAPHY Left 09/20/2021  ? Procedure: Lower Extremity Angiography;  Surgeon: Algernon Huxley, MD;  Location: [facility];  Service: Cardiovascular;  Laterality: Left;  ? PACEMAKER INSERTION N/A 10/31/2016  ? Procedure: INSERTION PACEMAKER;  Surgeon: Isaias Cowman, MD;  Location: [facility];  Service: Cardiovascular;  Laterality: N/A;  ? TOE AMPUTATION Right   ? ? ?Family History  ?Problem Relation Age of Onset  ? Diabetes Mother   ? Stroke Mother   ? Cancer Father   ? ? ?No Known Allergies ? ? ?  Latest Ref Rng & Units 10/29/2016  ?  2:12 PM 05/19/2016  ?  2:35 AM 05/18/2016  ?  6:06 PM  ?CBC  ?WBC 3.8 - 10.6 K/uL 6.8   10.4   10.7    ?Hemoglobin 13.0 - 18.0 g/dL 12.7   14.1   14.3    ?Hematocrit 40.0 - 52.0 % 40.5   42.1   43.9    ?Platelets 150 - 440 K/uL 199   221   210    ? ? ? ? ?CMP  ?   ?Component Value  Date/Time  ? NA 141 10/29/2016 1412  ? K 4.1 10/29/2016 1412  ? CL 106 10/29/2016 1412  ? CO2 31 10/29/2016 1412  ? GLUCOSE 70 10/29/2016 1412  ? BUN 35 (H) 09/20/2021 0847  ? CREATININE 1.57 (H) 09/20/2021 0847  ? CALCIUM 9.2 10/29/2016 1412  ? GFRNONAA 48 (L) 09/20/2021 0847  ? GFRAA 55 (L) 10/29/2016 1412  ? ? ? ?VAS [place] ABI WITH/WO TBI ? ?Result Date: 10/22/2021 ? LOWER EXTREMITY DOPPLER STUDY Patient Name:  Bruce Bartlett  Date of Exam:   10/17/2021 Medical Rec #: [id number]         Accession #:    [id number] Date of Birth: 03/21/1956         Patient Gender: M Patient Age:   52 years Exam Location:  [facility] Procedure:      VAS [place] ABI WITH/WO TBI Referring Phys: --------------------------------------------------------------------------------  Indications: Peripheral artery disease.  Vascular Interventions: 09/20/2021 right Thrombectomy, PTA stent of popliteal. Performing Technologist: Concha Norway RVT  Examination Guidelines: A complete evaluation includes at minimum, Doppler waveform signals and systolic blood pressure reading at the level of bilateral brachial, anterior tibial, and posterior tibial arteries, when vessel segments are accessible. Bilateral testing is considered an integral part of a complete examination. Photoelectric Plethysmograph (PPG) waveforms and toe systolic pressure readings are included as required and additional duplex testing as needed. Limited examinations for reoccurring indications may be performed as noted.  ABI Findings: +---------+------------------+-----+--------+--------+ Right    Rt Pressure (mmHg)IndexWaveformComment  +---------+------------------+-----+--------+--------+ Brachial 139                                     +---------+------------------+-----+--------+--------+ ATA      160               1.15 biphasic         +---------+------------------+-----+--------+--------+ PTA      132               0.95 biphasic          +---------+------------------+-----+--------+--------+ Great Toe                               AMP      +---------+------------------+-----+--------+--------+ +---------+------------------+-----+--------+-------+  Left     Lt Pressure (mmHg)IndexWaveformComment +---------+------------------+-----+--------+-------+ Brachial 130                                    +---------+------------------+-----+--------+-------+ ATA      133               0.96 biphasic        +---------+------------------+-----+--------+-------+ PTA      134               0.96 biphasic        +---------+------------------+-----+--------+-------+ Great Toe97                0.70 Normal          +---------+------------------+-----+--------+-------+ +-------+-----------+-----------+------------+------------+ ABI/TBIToday's ABIToday's TBIPrevious ABIPrevious TBI +-------+-----------+-----------+------------+------------+ Right  1.15       amp        .91         amp          +-------+-----------+-----------+------------+------------+ Left   .96        .70        .78         .64          +-------+-----------+-----------+------------+------------+ Bilateral ABIs appear increased compared to prior study on 09/17/2021.  Summary: Right: Resting right ankle-brachial index is within normal range. No evidence of significant right lower extremity arterial disease. Rt great toe amputated. Left: Resting left ankle-brachial index is within normal range. No evidence of significant left lower extremity arterial disease. The left toe-brachial index is normal.  *See table(s) above for measurements and observations.  Electronically signed by Leotis Pain MD on 10/22/2021 at 4:09:13 PM.    Final   ? ?VAS [place] ABI WITH/WO TBI ? ?Result Date: 09/17/2021 ? LOWER EXTREMITY DOPPLER STUDY Patient Name:  Bruce Bartlett  Date of Exam:   09/11/2021 Medical Rec #: [id number]         Accession #:    [id number] Date of Birth: 27-Oct-1955          Patient Gender: M Patient Age:   22 years Exam Location:  [facility] Procedure:      VAS [place] ABI WITH/WO TBI Referring Phys: Corene Cornea DEW --------------------------------------------------------------------------------  Indications: Peripheral artery disease.  Performing Technologist: Lyman Bishop

## 2022-01-16 ENCOUNTER — Other Ambulatory Visit (INDEPENDENT_AMBULATORY_CARE_PROVIDER_SITE_OTHER): Payer: Self-pay | Admitting: Nurse Practitioner

## 2022-01-16 ENCOUNTER — Encounter (INDEPENDENT_AMBULATORY_CARE_PROVIDER_SITE_OTHER): Payer: Self-pay | Admitting: Nurse Practitioner

## 2022-01-16 ENCOUNTER — Ambulatory Visit (INDEPENDENT_AMBULATORY_CARE_PROVIDER_SITE_OTHER): Payer: Medicare Other

## 2022-01-16 ENCOUNTER — Ambulatory Visit (INDEPENDENT_AMBULATORY_CARE_PROVIDER_SITE_OTHER): Payer: Medicare Other | Admitting: Nurse Practitioner

## 2022-01-16 VITALS — BP 164/84 | HR 70 | Resp 16 | Wt 264.4 lb

## 2022-01-16 DIAGNOSIS — I739 Peripheral vascular disease, unspecified: Secondary | ICD-10-CM

## 2022-01-16 DIAGNOSIS — E119 Type 2 diabetes mellitus without complications: Secondary | ICD-10-CM

## 2022-01-16 DIAGNOSIS — I10 Essential (primary) hypertension: Secondary | ICD-10-CM

## 2022-01-16 DIAGNOSIS — Z9889 Other specified postprocedural states: Secondary | ICD-10-CM

## 2022-01-16 DIAGNOSIS — R6889 Other general symptoms and signs: Secondary | ICD-10-CM

## 2022-01-16 DIAGNOSIS — I70211 Atherosclerosis of native arteries of extremities with intermittent claudication, right leg: Secondary | ICD-10-CM | POA: Diagnosis not present

## 2022-01-17 ENCOUNTER — Ambulatory Visit (INDEPENDENT_AMBULATORY_CARE_PROVIDER_SITE_OTHER): Payer: Medicare Other | Admitting: Nurse Practitioner

## 2022-01-28 ENCOUNTER — Encounter: Payer: Self-pay | Admitting: Urology

## 2022-01-28 ENCOUNTER — Ambulatory Visit (INDEPENDENT_AMBULATORY_CARE_PROVIDER_SITE_OTHER): Payer: Medicare Other | Admitting: Urology

## 2022-01-28 VITALS — BP 119/64 | HR 73 | Ht 74.0 in | Wt 260.0 lb

## 2022-01-28 DIAGNOSIS — R351 Nocturia: Secondary | ICD-10-CM

## 2022-01-28 DIAGNOSIS — R3915 Urgency of urination: Secondary | ICD-10-CM

## 2022-01-28 DIAGNOSIS — N3941 Urge incontinence: Secondary | ICD-10-CM | POA: Diagnosis not present

## 2022-01-28 DIAGNOSIS — R350 Frequency of micturition: Secondary | ICD-10-CM | POA: Diagnosis not present

## 2022-01-28 LAB — BLADDER SCAN AMB NON-IMAGING: Scan Result: 29

## 2022-01-28 MED ORDER — MIRABEGRON ER 50 MG PO TB24: 50.0000 mg | ORAL_TABLET | Freq: Every day | ORAL | 0 refills | Status: DC

## 2022-01-28 NOTE — Progress Notes (Signed)
01/28/2022 1:56 PM   Lorie Apley Jun 07, 1956 [id number]  Referring provider: Lonell Face, MD [id number] [facility] [address] [facility] [place],  [place] [address]  Chief Complaint  Patient presents with   Urinary Frequency    HPI: Bruce Bartlett is a 66 y.o. male referred for evaluation of urinary frequency and nocturia.  1 year history of urinary frequency and urgency.  Occasional episodes of urge incontinence Has nocturia x3 which is also bothersome.  Does give a history of snoring.  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   PMH: Past Medical History:  Diagnosis Date   Cardiomyopathy (HCC)    Chronic kidney disease    CKD (chronic kidney disease) stage 3, GFR 30-59 ml/min (HCC)    Diabetes mellitus without complication (HCC)    Dyspnea    Hyperlipidemia    Hypertension    Hypothyroidism    Peripheral vascular disease (HCC)    Stroke [facility])     Surgical History: Past Surgical History:  Procedure Laterality Date   BACK SURGERY     LOWER EXTREMITY ANGIOGRAPHY Left 09/20/2021   Procedure: Lower Extremity Angiography;  Surgeon: Annice Needy, MD;  Location: [facility];  Service: Cardiovascular;  Laterality: Left;   PACEMAKER INSERTION N/A 10/31/2016   Procedure: INSERTION PACEMAKER;  Surgeon: Marcina Millard, MD;  Location: [facility];  Service: Cardiovascular;  Laterality: N/A;   TOE AMPUTATION Right     Home Medications:  Allergies as of 01/28/2022   No Known Allergies      Medication List        Accurate as of January 28, 2022  1:56 PM. If you have any questions, ask your nurse or doctor.          amLODipine 5 MG tablet Commonly known as: NORVASC Take 5 tablets by mouth daily.   aspirin EC  81 MG tablet Take 1 tablet (81 mg total) by mouth daily.   atorvastatin 40 MG tablet Commonly known as: LIPITOR Take 1 tablet by mouth daily.   brimonidine 0.2 % ophthalmic solution Commonly known as: ALPHAGAN Place 1 drop into both eyes 2 (two) times daily.   carvedilol 3.125 MG tablet Commonly known as: COREG Take 3.125 mg by mouth 2 (two) times daily.   clopidogrel 75 MG tablet Commonly known as: Plavix Take 1 tablet (75 mg total) by mouth daily.   dorzolamidel-timolol 22.3-6.8 MG/ML Soln ophthalmic solution Commonly known as: COSOPT Place 1 drop into both eyes at bedtime.   Eliquis 5 MG Tabs tablet Generic drug: apixaban Take 5 mg by mouth 2 (two) times daily.   gabapentin 300 MG capsule Commonly known as: NEURONTIN Take 300 mg by mouth at bedtime.   hydrALAZINE 100 MG tablet Commonly known as: APRESOLINE Take 1 tablet (100 mg total) by mouth 3 (three) times daily.   hydrochlorothiazide 25 MG tablet Commonly known as: HYDRODIURIL Take 25 mg by mouth daily.   ibuprofen 800 MG tablet Commonly known as: ADVIL Take 800 mg by mouth every 6 (six) hours as needed.   insulin aspart 100 UNIT/ML FlexPen Commonly known as: NOVOLOG Inject into the skin. 14 units lunch and 22 units at dinner   insulin NPH Human 100 UNIT/ML injection Commonly known as: NOVOLIN N Inject 20 Units into the  skin 2 (two) times daily before a meal.   isosorbide mononitrate 30 MG 24 hr tablet Commonly known as: IMDUR Take 1 tablet by mouth daily.   lactulose 10 g packet Commonly known as: CEPHULAC Take 10 g by mouth 3 (three) times daily as needed.   latanoprost 0.005 % ophthalmic solution Commonly known as: XALATAN Place 1 drop into both eyes at bedtime.   levothyroxine 88 MCG tablet Commonly known as: SYNTHROID TAKE 1 TABLET BY MOUTH ONCE DAILY ON AN EMPTY STOMACH  WITH A GLASS OF WATER 30-60 MINUTES BEFORE BREAKFAST   losartan 100 MG tablet Commonly known as: COZAAR Take 1  tablet by mouth daily.   metoCLOPramide 5 MG tablet Commonly known as: Reglan Take 1 tablet (5 mg total) by mouth every 8 (eight) hours as needed for nausea or vomiting.   Multi-Vitamins Tabs Take by mouth.   omega-3 acid ethyl esters 1 g capsule Commonly known as: LOVAZA Take 1 g by mouth daily.   pregabalin 100 MG capsule Commonly known as: LYRICA Take by mouth.   tamsulosin 0.4 MG Caps capsule Commonly known as: FLOMAX Take 0.4 mg by mouth daily.   torsemide 20 MG tablet Commonly known as: DEMADEX Take by mouth.   Evaristo Bury FlexTouch 200 UNIT/ML FlexTouch Pen Generic drug: insulin degludec Inject 55 units daily   vitamin B-12 1000 MCG tablet Commonly known as: CYANOCOBALAMIN Take 1,000 mcg by mouth daily.        Allergies: No Known Allergies  Family History: Family History  Problem Relation Age of Onset   Diabetes Mother    Stroke Mother    Cancer Father     Social History:  reports that he has quit smoking. He has never used smokeless tobacco. He reports that he does not drink alcohol and does not use drugs.   Physical Exam: BP 119/64   Pulse 73   Ht 6\' 2"  (1.88 m)   Wt 260 lb (117.9 kg)   BMI 33.38 kg/m   Constitutional:  Alert and oriented, No acute distress. HEENT: [place] AT Cardiovascular: No clubbing, cyanosis, or edema. Respiratory: Normal respiratory effort, no increased work of breathing. Psychiatric: Normal mood and affect.   Assessment & Plan:   66 y.o. male with previous neurologic history and moderate storage related voiding symptoms including frequency, urgency with occasional episodes of urge incontinence.  We discussed possibility of neurogenic detrusor activity He also complains of nocturia which may or may not be related as he does have a history of snoring and we discussed sleep apnea is a common cause of nocturia Taking tamsulosin Add Myrbetriq 50 mg daily-samples given.  PA follow-up 1 month for symptom reassessment PVR today was  29 mL Urinalysis was unremarkable PA follow-up 1 month symptom reassessment   79, MD  [facility] [address] [place], [place] [phone]

## 2022-01-29 LAB — URINALYSIS, COMPLETE
Bilirubin, UA: NEGATIVE
Glucose, UA: NEGATIVE
Leukocytes,UA: NEGATIVE
Nitrite, UA: NEGATIVE
RBC, UA: NEGATIVE
Specific Gravity, UA: 1.02 (ref 1.005–1.030)
Urobilinogen, Ur: 1 mg/dL (ref 0.2–1.0)
pH, UA: 5 (ref 5.0–7.5)

## 2022-01-29 LAB — MICROSCOPIC EXAMINATION: Bacteria, UA: NONE SEEN

## 2022-02-02 ENCOUNTER — Encounter (INDEPENDENT_AMBULATORY_CARE_PROVIDER_SITE_OTHER): Payer: Self-pay | Admitting: Nurse Practitioner

## 2022-02-02 NOTE — Progress Notes (Signed)
Subjective:    Patient ID: Bruce Bartlett, male    DOB: Dec 25, 1955, 66 y.o.   MRN: [id number] Chief Complaint  Patient presents with   Follow-up    Ultrasound follow up    The patient returns to the office for followup and review of the noninvasive studies.  She recently underwent intervention on the right lower extremity on 09/20/2021.  There have been no interval changes in lower extremity symptoms. No interval shortening of the patient's claudication distance or development of rest pain symptoms. No new ulcers or wounds have occurred since the last visit.  There have been no significant changes to the patient's overall health care.  The patient denies amaurosis fugax or recent TIA symptoms. There are no documented recent neurological changes noted. There is no history of DVT, PE or superficial thrombophlebitis. The patient denies recent episodes of angina or shortness of breath.   ABI Rt=0.95 and Lt=0.58  (previous ABI's Rt=1.15 and Lt=0.96) Duplex ultrasound of the right lower extremity shows biphasic/monophasic waveforms with monophasic tibial artery waveforms on the left.  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    Review of Systems  Cardiovascular:  Negative for leg swelling.  Skin:  Negative for wound.  All other systems reviewed and are negative.      Objective:   Physical Exam Vitals reviewed.  HENT:     Head: Normocephalic.  Cardiovascular:     Rate and Rhythm: Normal rate.     Pulses:          Dorsalis pedis pulses are detected w/ Doppler on the right side and detected w/ Doppler on the left side.       Posterior tibial pulses are detected w/ Doppler on the right side and detected w/ Doppler on the left side.  Pulmonary:     Effort: Pulmonary effort is normal.  Skin:    General: Skin is warm and dry.  Neurological:     Mental Status: He is alert and oriented to person, place, and time.   Psychiatric:        Mood and Affect: Mood normal.        Behavior: Behavior normal.        Thought Content: Thought content normal.        Judgment: Judgment normal.     BP (!) 164/84 (BP Location: Right Arm)   Pulse 70   Resp 16   Wt 264 lb 6.4 oz (119.9 kg)   BMI 34.41 kg/m   Past Medical History:  Diagnosis Date   Cardiomyopathy (HCC)    Chronic kidney disease    CKD (chronic kidney disease) stage 3, GFR 30-59 ml/min (HCC)    Diabetes mellitus without complication (HCC)    Dyspnea    Hyperlipidemia    Hypertension    Hypothyroidism    Peripheral vascular disease (HCC)    Stroke (HCC)     Social History   Socioeconomic History   Marital status: Married    Spouse name: Not on file   Number of children: Not on file   Years of education: Not on file   Highest education level: Not on file  Occupational History   Not on file  Tobacco Use   Smoking status: Former   Smokeless tobacco: Never  Substance and Sexual Activity   Alcohol use: No   Drug use: No   Sexual activity: Not on file  Other Topics Concern  Not on file  Social History Narrative   Not on file   Social Determinants of Health   Financial Resource Strain: Not on file  Food Insecurity: Not on file  Transportation Needs: Not on file  Physical Activity: Not on file  Stress: Not on file  Social Connections: Not on file  Intimate Partner Violence: Not on file    Past Surgical History:  Procedure Laterality Date   BACK SURGERY     LOWER EXTREMITY ANGIOGRAPHY Left 09/20/2021   Procedure: Lower Extremity Angiography;  Surgeon: Algernon Huxley, MD;  Location: [facility];  Service: Cardiovascular;  Laterality: Left;   PACEMAKER INSERTION N/A 10/31/2016   Procedure: INSERTION PACEMAKER;  Surgeon: Isaias Cowman, MD;  Location: [facility];  Service: Cardiovascular;  Laterality: N/A;   TOE AMPUTATION Right     Family History  Problem Relation Age of Onset   Diabetes Mother    Stroke  Mother    Cancer Father     No Known Allergies     Latest Ref Rng & Units 10/29/2016    2:12 PM 05/19/2016    2:35 AM 05/18/2016    6:06 PM  CBC  WBC 3.8 - 10.6 K/uL 6.8  10.4  10.7   Hemoglobin 13.0 - 18.0 g/dL 12.7  14.1  14.3   Hematocrit 40.0 - 52.0 % 40.5  42.1  43.9   Platelets 150 - 440 K/uL 199  221  210       CMP     Component Value Date/Time   NA 141 10/29/2016 1412   K 4.1 10/29/2016 1412   CL 106 10/29/2016 1412   CO2 31 10/29/2016 1412   GLUCOSE 70 10/29/2016 1412   BUN 35 (H) 09/20/2021 0847   CREATININE 1.57 (H) 09/20/2021 0847   CALCIUM 9.2 10/29/2016 1412   GFRNONAA 48 (L) 09/20/2021 0847   GFRAA 55 (L) 10/29/2016 1412     VAS [place] ABI WITH/WO TBI  Result Date: 01/18/2022  LOWER EXTREMITY DOPPLER STUDY Patient Name:  Bruce Bartlett  Date of Exam:   01/16/2022 Medical Rec #: [id number]         Accession #:    [id number] Date of Birth: Jul 05, 1956         Patient Gender: M Patient Age:   67 years Exam Location:  [facility] Procedure:      VAS [place] ABI WITH/WO TBI Referring Phys: Eulogio Ditch --------------------------------------------------------------------------------  Indications: Peripheral artery disease. High Risk Factors: Hypertension, hyperlipidemia, Diabetes.  Vascular Interventions: 09/20/2021 right Thrombectomy, PTA stent of popliteal. Comparison Study: 10/17/2021 Performing Technologist: Delorise Shiner RVT  Examination Guidelines: A complete evaluation includes at minimum, Doppler waveform signals and systolic blood pressure reading at the level of bilateral brachial, anterior tibial, and posterior tibial arteries, when vessel segments are accessible. Bilateral testing is considered an integral part of a complete examination. Photoelectric Plethysmograph (PPG) waveforms and toe systolic pressure readings are included as required and additional duplex testing as needed. Limited examinations for reoccurring indications may be performed as noted.   ABI Findings: +---------+------------------+-----+----------+--------+ Right    Rt Pressure (mmHg)IndexWaveform  Comment  +---------+------------------+-----+----------+--------+ Brachial 172                                       +---------+------------------+-----+----------+--------+ ATA      163  0.95 biphasic           +---------+------------------+-----+----------+--------+ PTA      149               0.87 monophasic         +---------+------------------+-----+----------+--------+ Great Toe                                 Amp      +---------+------------------+-----+----------+--------+ +---------+------------------+-----+----------+-------+ Left     Lt Pressure (mmHg)IndexWaveform  Comment +---------+------------------+-----+----------+-------+ Brachial 169                                      +---------+------------------+-----+----------+-------+ ATA      99                0.58 monophasic        +---------+------------------+-----+----------+-------+ PTA      100               0.58 monophasic        +---------+------------------+-----+----------+-------+ Great Toe68                0.40 Normal            +---------+------------------+-----+----------+-------+ +-------+-----------+-----------+------------+------------+ ABI/TBIToday's ABIToday's TBIPrevious ABIPrevious TBI +-------+-----------+-----------+------------+------------+ Right  .95        Amp        1.15        amp          +-------+-----------+-----------+------------+------------+ Left   .58        .40        .96         .70          +-------+-----------+-----------+------------+------------+  Left ABIs and TBIs appear decreased compared to prior study on 10/17/2021. Right ABIs appear essentially unchanged compared to prior study on 10/17/2021.  Summary: Right: Resting right ankle-brachial index is within normal range. No evidence of significant right lower  extremity arterial disease. Great toe amputated. Left: Resting left ankle-brachial index indicates moderate left lower extremity arterial disease. The left toe-brachial index is abnormal. *See table(s) above for measurements and observations.  Electronically signed by [name] MD on 01/18/2022 at 11:33:17 AM.    Final        Assessment & Plan:   1. Atherosclerosis of native artery of right lower extremity with intermittent claudication (HCC) The patient has had a decrease in the left-sided studies.  However at this time he does not wish to move forward with intervention as he is not having any significant issues with following or pain.  We will maintain close follow-up and have her return in 3 months with noninvasive studies.0.85  2. Type 2 diabetes mellitus without complication, unspecified whether long term insulin use (HCC) Continue hypoglycemic medications as already ordered, these medications have been reviewed and there are no changes at this time.  Hgb A1C to be monitored as already arranged by primary service   3. Essential (primary) hypertension Continue antihypertensive medications as already ordered, these medications have been reviewed and there are no changes at this time.    Current Outpatient Medications on File Prior to Visit  Medication Sig Dispense Refill   amLODipine (NORVASC) 5 MG tablet Take 5 tablets by mouth daily.     aspirin EC 81 MG tablet Take 1 tablet (81 mg total) by mouth daily. 150  tablet 2   atorvastatin (LIPITOR) 40 MG tablet Take 1 tablet by mouth daily.     brimonidine (ALPHAGAN) 0.2 % ophthalmic solution Place 1 drop into both eyes 2 (two) times daily.     carvedilol (COREG) 3.125 MG tablet Take 3.125 mg by mouth 2 (two) times daily.     clopidogrel (PLAVIX) 75 MG tablet Take 1 tablet (75 mg total) by mouth daily. 30 tablet 11   dorzolamidel-timolol (COSOPT) 22.3-6.8 MG/ML SOLN ophthalmic solution Place 1 drop into both eyes at bedtime.     ELIQUIS 5 MG  TABS tablet Take 5 mg by mouth 2 (two) times daily.     gabapentin (NEURONTIN) 300 MG capsule Take 300 mg by mouth at bedtime.     hydrALAZINE (APRESOLINE) 100 MG tablet Take 1 tablet (100 mg total) by mouth 3 (three) times daily. 30 tablet 0   hydrochlorothiazide (HYDRODIURIL) 25 MG tablet Take 25 mg by mouth daily.     ibuprofen (ADVIL,MOTRIN) 800 MG tablet Take 800 mg by mouth every 6 (six) hours as needed.     insulin aspart (NOVOLOG) 100 UNIT/ML FlexPen Inject into the skin. 14 units lunch and 22 units at dinner     insulin degludec (TRESIBA FLEXTOUCH) 200 UNIT/ML FlexTouch Pen Inject 55 units daily     insulin NPH Human (HUMULIN N,NOVOLIN N) 100 UNIT/ML injection Inject 20 Units into the skin 2 (two) times daily before a meal.     isosorbide mononitrate (IMDUR) 30 MG 24 hr tablet Take 1 tablet by mouth daily.     lactulose (CEPHULAC) 10 g packet Take 10 g by mouth 3 (three) times daily as needed.     latanoprost (XALATAN) 0.005 % ophthalmic solution Place 1 drop into both eyes at bedtime.     levothyroxine (SYNTHROID, LEVOTHROID) 88 MCG tablet TAKE 1 TABLET BY MOUTH ONCE DAILY ON AN EMPTY STOMACH  WITH A GLASS OF WATER 30-60 MINUTES BEFORE BREAKFAST     losartan (COZAAR) 100 MG tablet Take 1 tablet by mouth daily.     metoCLOPramide (REGLAN) 5 MG tablet Take 1 tablet (5 mg total) by mouth every 8 (eight) hours as needed for nausea or vomiting. 15 tablet 0   Multiple Vitamin (MULTI-VITAMINS) TABS Take by mouth.     pregabalin (LYRICA) 100 MG capsule Take by mouth.     tamsulosin (FLOMAX) 0.4 MG CAPS capsule Take 0.4 mg by mouth daily.     torsemide (DEMADEX) 20 MG tablet Take by mouth.     vitamin B-12 (CYANOCOBALAMIN) 1000 MCG tablet Take 1,000 mcg by mouth daily.     omega-3 acid ethyl esters (LOVAZA) 1 g capsule Take 1 g by mouth daily. (Patient not taking: Reported on 10/17/2021)     No current facility-administered medications on file prior to visit.    There are no Patient  Instructions on file for this visit. No follow-ups on file.   Georgiana Spinner, NP

## 2022-02-28 ENCOUNTER — Ambulatory Visit: Payer: Medicare Other | Admitting: Urology

## 2022-03-01 ENCOUNTER — Encounter: Payer: Self-pay | Admitting: Urology

## 2022-03-11 ENCOUNTER — Ambulatory Visit: Payer: Medicare Other | Admitting: Urology

## 2022-03-11 ENCOUNTER — Encounter: Payer: Self-pay | Admitting: Urology

## 2022-03-11 VITALS — BP 168/80 | HR 78 | Ht 74.0 in | Wt 265.0 lb

## 2022-03-11 DIAGNOSIS — R351 Nocturia: Secondary | ICD-10-CM

## 2022-03-11 DIAGNOSIS — N401 Enlarged prostate with lower urinary tract symptoms: Secondary | ICD-10-CM | POA: Diagnosis not present

## 2022-03-11 DIAGNOSIS — R350 Frequency of micturition: Secondary | ICD-10-CM | POA: Diagnosis not present

## 2022-03-11 LAB — URINALYSIS, COMPLETE
Bilirubin, UA: NEGATIVE
Ketones, UA: NEGATIVE
Leukocytes,UA: NEGATIVE
Nitrite, UA: NEGATIVE
RBC, UA: NEGATIVE
Specific Gravity, UA: 1.02 (ref 1.005–1.030)
Urobilinogen, Ur: 0.2 mg/dL (ref 0.2–1.0)
pH, UA: 5 (ref 5.0–7.5)

## 2022-03-11 LAB — MICROSCOPIC EXAMINATION

## 2022-03-11 MED ORDER — MIRABEGRON ER 50 MG PO TB24: 50.0000 mg | ORAL_TABLET | Freq: Every day | ORAL | 3 refills | Status: DC

## 2022-03-11 NOTE — Progress Notes (Signed)
03/11/2022 7:29 AM   Lorie Apley [birth year] [id number]  Referring provider: Marguarite Arbour, MD [address] [facility] [place],  [place] [address]  Chief Complaint  Patient presents with   Follow-up    HPI: 66 y.o. male initially seen 01/28/2022 for evaluation of urinary frequency and nocturia.  He was given a trial of Myrbetriq 50 mg end presents for 1 month follow-up.  Refer to my prior note 7/10 for details. He has noted significant improvement in his daytime storage related voiding symptoms.  Still having some variable nocturia Desires to continue Myrbetriq.   PMH: Past Medical History:  Diagnosis Date   Cardiomyopathy (HCC)    Chronic kidney disease    CKD (chronic kidney disease) stage 3, GFR 30-59 ml/min (HCC)    Diabetes mellitus without complication (HCC)    Dyspnea    Hyperlipidemia    Hypertension    Hypothyroidism    Peripheral vascular disease (HCC)    Stroke [facility])     Surgical History: Past Surgical History:  Procedure Laterality Date   BACK SURGERY     LOWER EXTREMITY ANGIOGRAPHY Left 09/20/2021   Procedure: Lower Extremity Angiography;  Surgeon: Annice Needy, MD;  Location: [facility];  Service: Cardiovascular;  Laterality: Left;   PACEMAKER INSERTION N/A 10/31/2016   Procedure: INSERTION PACEMAKER;  Surgeon: Marcina Millard, MD;  Location: [facility];  Service: Cardiovascular;  Laterality: N/A;   TOE AMPUTATION Right     Home Medications:  Allergies as of 03/11/2022   No Known Allergies      Medication List        Accurate as of March 11, 2022 11:59 PM. If you have any questions, ask your nurse or doctor.          amLODipine 5 MG tablet Commonly known as: NORVASC Take 5 tablets by mouth daily.   aspirin EC 81 MG tablet Take 1 tablet (81 mg total) by mouth daily.   atorvastatin 40 MG tablet Commonly known as: LIPITOR Take 1 tablet by mouth daily.   brimonidine 0.2 % ophthalmic  solution Commonly known as: ALPHAGAN Place 1 drop into both eyes 2 (two) times daily.   carvedilol 3.125 MG tablet Commonly known as: COREG Take 3.125 mg by mouth 2 (two) times daily.   clopidogrel 75 MG tablet Commonly known as: Plavix Take 1 tablet (75 mg total) by mouth daily.   cyanocobalamin 1000 MCG tablet Commonly known as: VITAMIN B12 Take 1,000 mcg by mouth daily.   dorzolamidel-timolol 22.3-6.8 MG/ML Soln ophthalmic solution Commonly known as: COSOPT Place 1 drop into both eyes at bedtime.   Eliquis 5 MG Tabs tablet Generic drug: apixaban Take 5 mg by mouth 2 (two) times daily.   gabapentin 300 MG capsule Commonly known as: NEURONTIN Take 300 mg by mouth at bedtime.   hydrALAZINE 100 MG tablet Commonly known as: APRESOLINE Take 1 tablet (100 mg total) by mouth 3 (three) times daily.   hydrochlorothiazide 25 MG tablet Commonly known as: HYDRODIURIL Take 25 mg by mouth daily.   ibuprofen 800 MG tablet Commonly known as: ADVIL Take 800 mg by mouth every 6 (six) hours as needed.   insulin aspart 100 UNIT/ML FlexPen Commonly known as: NOVOLOG Inject into the skin. 14 units lunch and 22 units at dinner   insulin NPH Human 100 UNIT/ML injection Commonly known as: NOVOLIN N Inject 20 Units into the skin 2 (two) times daily before a meal.   isosorbide mononitrate  30 MG 24 hr tablet Commonly known as: IMDUR Take 1 tablet by mouth daily.   lactulose 10 g packet Commonly known as: CEPHULAC Take 10 g by mouth 3 (three) times daily as needed.   latanoprost 0.005 % ophthalmic solution Commonly known as: XALATAN Place 1 drop into both eyes at bedtime.   levothyroxine 88 MCG tablet Commonly known as: SYNTHROID TAKE 1 TABLET BY MOUTH ONCE DAILY ON AN EMPTY STOMACH  WITH A GLASS OF WATER 30-60 MINUTES BEFORE BREAKFAST   losartan 100 MG tablet Commonly known as: COZAAR Take 1 tablet by mouth daily.   metoCLOPramide 5 MG tablet Commonly known as: Reglan Take  1 tablet (5 mg total) by mouth every 8 (eight) hours as needed for nausea or vomiting.   mirabegron ER 50 MG Tb24 tablet Commonly known as: MYRBETRIQ Take 1 tablet (50 mg total) by mouth daily.   Multi-Vitamins Tabs Take by mouth.   omega-3 acid ethyl esters 1 g capsule Commonly known as: LOVAZA Take 1 g by mouth daily.   pregabalin 100 MG capsule Commonly known as: LYRICA Take by mouth.   tamsulosin 0.4 MG Caps capsule Commonly known as: FLOMAX Take 0.4 mg by mouth daily.   torsemide 20 MG tablet Commonly known as: DEMADEX Take by mouth.   Evaristo Bury FlexTouch 200 UNIT/ML FlexTouch Pen Generic drug: insulin degludec Inject 55 units daily        Allergies: No Known Allergies  Family History: Family History  Problem Relation Age of Onset   Diabetes Mother    Stroke Mother    Cancer Father     Social History:  reports that he has quit smoking. He has never used smokeless tobacco. He reports that he does not drink alcohol and does not use drugs.   Physical Exam: BP (!) 168/80   Pulse 78   Ht 6\' 2"  (1.88 m)   Wt 265 lb (120.2 kg)   BMI 34.02 kg/m   Constitutional:  Alert and oriented, No acute distress. HEENT: [place] AT Cardiovascular: No clubbing, cyanosis, or edema. Respiratory: Normal respiratory effort, no increased work of breathing. Psychiatric: Normal mood and affect.   Assessment & Plan:    1.  BPH with LUTS Continue tamsulosin Significant improvement in storage related voiding symptoms.  Rx Myrbetriq sent to pharmacy  2.  Nocturia Mild improvement We again discussed the possibility of sleep apnea as an etiology of his nocturia.  If nocturia continues to be bothersome would recommend he undergo sleep study evaluation by PCP  Follow-up 1 year and instructed call earlier for worsening voiding symptoms   , MD  [facility] [address], [address] [place], [place] [phone]

## 2022-03-12 ENCOUNTER — Encounter: Payer: Self-pay | Admitting: Urology

## 2022-03-13 ENCOUNTER — Telehealth: Payer: Self-pay | Admitting: Urology

## 2022-03-13 NOTE — Telephone Encounter (Signed)
Patient (and his wife) called the office today to report that the Rx for Myrbetriq is too expensive.   They are wanting to know if there is anything less expensive that they can try.  Please advise.

## 2022-03-15 NOTE — Telephone Encounter (Signed)
Need to know what overactive bladder alternative medications are on his Medicare formulary.

## 2022-03-15 NOTE — Telephone Encounter (Signed)
LMOM informed patient to contact insurance company to get the medication list for OAB on the medicare formulary plan.

## 2022-03-18 NOTE — Telephone Encounter (Signed)
Patient would like to try Oxybutynin per insurance

## 2022-03-19 MED ORDER — OXYBUTYNIN CHLORIDE ER 10 MG PO TB24: 10.0000 mg | ORAL_TABLET | Freq: Every day | ORAL | 0 refills | Status: DC

## 2022-03-19 NOTE — Addendum Note (Signed)
Addended by: Riki Altes on: 03/19/2022 07:23 AM   Modules accepted: Orders

## 2022-04-02 ENCOUNTER — Ambulatory Visit (INDEPENDENT_AMBULATORY_CARE_PROVIDER_SITE_OTHER): Payer: Medicare Other | Admitting: Vascular Surgery

## 2022-04-02 ENCOUNTER — Encounter (INDEPENDENT_AMBULATORY_CARE_PROVIDER_SITE_OTHER): Payer: Medicare Other

## 2022-04-16 ENCOUNTER — Other Ambulatory Visit (INDEPENDENT_AMBULATORY_CARE_PROVIDER_SITE_OTHER): Payer: Self-pay | Admitting: Nurse Practitioner

## 2022-04-16 DIAGNOSIS — Z9889 Other specified postprocedural states: Secondary | ICD-10-CM

## 2022-04-17 ENCOUNTER — Encounter (INDEPENDENT_AMBULATORY_CARE_PROVIDER_SITE_OTHER): Payer: Self-pay | Admitting: Nurse Practitioner

## 2022-04-17 ENCOUNTER — Ambulatory Visit (INDEPENDENT_AMBULATORY_CARE_PROVIDER_SITE_OTHER): Payer: Medicare Other

## 2022-04-17 ENCOUNTER — Ambulatory Visit (INDEPENDENT_AMBULATORY_CARE_PROVIDER_SITE_OTHER): Payer: Medicare Other | Admitting: Nurse Practitioner

## 2022-04-17 VITALS — BP 150/76 | HR 67 | Resp 18

## 2022-04-17 DIAGNOSIS — Z9889 Other specified postprocedural states: Secondary | ICD-10-CM

## 2022-04-17 DIAGNOSIS — I10 Essential (primary) hypertension: Secondary | ICD-10-CM | POA: Diagnosis not present

## 2022-04-17 DIAGNOSIS — I739 Peripheral vascular disease, unspecified: Secondary | ICD-10-CM

## 2022-04-17 DIAGNOSIS — I70211 Atherosclerosis of native arteries of extremities with intermittent claudication, right leg: Secondary | ICD-10-CM

## 2022-04-17 DIAGNOSIS — E119 Type 2 diabetes mellitus without complications: Secondary | ICD-10-CM

## 2022-04-18 ENCOUNTER — Encounter (INDEPENDENT_AMBULATORY_CARE_PROVIDER_SITE_OTHER): Payer: Self-pay | Admitting: Nurse Practitioner

## 2022-04-18 NOTE — Progress Notes (Signed)
Subjective:    Patient ID: Bruce Bartlett, male    DOB: 11-27-1955, 66 y.o.   MRN: [id number] No chief complaint on file.   The patient returns to the office for followup and review of the noninvasive studies.  he recently underwent intervention on the right lower extremity on 09/20/2021.   There have been no interval changes in lower extremity symptoms. No interval shortening of the patient's claudication distance or development of rest pain symptoms. No new ulcers or wounds have occurred since the last visit.   There have been no significant changes to the patient's overall health care.   The patient denies amaurosis fugax or recent TIA symptoms. There are no documented recent neurological changes noted. There is no history of DVT, PE or superficial thrombophlebitis. The patient denies recent episodes of angina or shortness of breath.    ABI Rt=1.05 and Lt=0.82 (previous ABI's Rt=0.95 and Lt=0.58) Duplex ultrasound of the right lower extremity shows triphasic/biphasic waveforms with monophasic waveforms in the left lower extremity.  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    Review of Systems  Musculoskeletal:  Positive for gait problem.  All other systems reviewed and are negative.      Objective:   Physical Exam Vitals reviewed.  HENT:     Head: Normocephalic.  Cardiovascular:     Rate and Rhythm: Normal rate.  Pulmonary:     Effort: Pulmonary effort is normal.  Musculoskeletal:     Right lower leg: Edema present.     Left lower leg: Edema present.  Skin:    General: Skin is warm and dry.  Neurological:     Mental Status: He is alert and oriented to person, place, and time.  Psychiatric:        Mood and Affect: Mood normal.        Behavior: Behavior normal.        Thought Content: Thought content normal.        Judgment: Judgment normal.     BP (!) 150/76 (BP Location: Right Arm)   Pulse 67   Resp 18   Past Medical History:  Diagnosis Date    Cardiomyopathy ([place])    Chronic kidney disease    CKD (chronic kidney disease) stage 3, GFR 30-59 ml/min (HCC)    Diabetes mellitus without complication (HCC)    Dyspnea    Hyperlipidemia    Hypertension    Hypothyroidism    Peripheral vascular disease (HCC)    Stroke (HCC)     Social History   Socioeconomic History   Marital status: Married    Spouse name: Not on file   Number of children: Not on file   Years of education: Not on file   Highest education level: Not on file  Occupational History   Not on file  Tobacco Use   Smoking status: Former   Smokeless tobacco: Never  Substance and Sexual Activity   Alcohol use: No   Drug use: No   Sexual activity: Not on file  Other Topics Concern   Not on file  Social History Narrative   Not on file   Social Determinants of Health   Financial Resource Strain: Not on file  Food Insecurity: Not on file  Transportation Needs: Not on file  Physical Activity: Not on file  Stress: Not on file  Social Connections: Not on file  Intimate Partner Violence: Not on file    Past Surgical History:  Procedure Laterality Date  BACK SURGERY     LOWER EXTREMITY ANGIOGRAPHY Left 09/20/2021   Procedure: Lower Extremity Angiography;  Surgeon: Algernon Huxley, MD;  Location: [facility];  Service: Cardiovascular;  Laterality: Left;   PACEMAKER INSERTION N/A 10/31/2016   Procedure: INSERTION PACEMAKER;  Surgeon: Isaias Cowman, MD;  Location: [facility];  Service: Cardiovascular;  Laterality: N/A;   TOE AMPUTATION Right     Family History  Problem Relation Age of Onset   Diabetes Mother    Stroke Mother    Cancer Father     No Known Allergies     Latest Ref Rng & Units 10/29/2016    2:12 PM 05/19/2016    2:35 AM 05/18/2016    6:06 PM  CBC  WBC 3.8 - 10.6 K/uL 6.8  10.4  10.7   Hemoglobin 13.0 - 18.0 g/dL 12.7  14.1  14.3   Hematocrit 40.0 - 52.0 % 40.5  42.1  43.9   Platelets 150 - 440 K/uL 199  221  210        CMP     Component Value Date/Time   NA 141 10/29/2016 1412   K 4.1 10/29/2016 1412   CL 106 10/29/2016 1412   CO2 31 10/29/2016 1412   GLUCOSE 70 10/29/2016 1412   BUN 35 (H) 09/20/2021 0847   CREATININE 1.57 (H) 09/20/2021 0847   CALCIUM 9.2 10/29/2016 1412   GFRNONAA 48 (L) 09/20/2021 0847   GFRAA 55 (L) 10/29/2016 1412     No results found.     Assessment & Plan:   1. Atherosclerosis of native artery of right lower extremity with intermittent claudication (HCC)  Recommend:  The patient has evidence of atherosclerosis of the lower extremities with claudication.  The patient does not voice lifestyle limiting changes at this point in time.  Noninvasive studies do not suggest clinically significant change.  No invasive studies, angiography or surgery at this time The patient should continue walking and begin a more formal exercise program.  The patient should continue antiplatelet therapy and aggressive treatment of the lipid abnormalities  No changes in the patient's medications at this time  Continued surveillance is indicated as atherosclerosis is likely to progress with time.    The patient will continue follow up with noninvasive studies as ordered.    2. Type 2 diabetes mellitus without complication, unspecified whether long term insulin use ([place]) Continue hypoglycemic medications as already ordered, these medications have been reviewed and there are no changes at this time.  Hgb A1C to be monitored as already arranged by primary service   3. Essential (primary) hypertension Continue antihypertensive medications as already ordered, these medications have been reviewed and there are no changes at this time.    Current Outpatient Medications on File Prior to Visit  Medication Sig Dispense Refill   amLODipine (NORVASC) 5 MG tablet Take 5 tablets by mouth daily.     aspirin EC 81 MG tablet Take 1 tablet (81 mg total) by mouth daily. 150 tablet 2    atorvastatin (LIPITOR) 40 MG tablet Take 1 tablet by mouth daily.     brimonidine (ALPHAGAN) 0.2 % ophthalmic solution Place 1 drop into both eyes 2 (two) times daily.     carvedilol (COREG) 3.125 MG tablet Take 3.125 mg by mouth 2 (two) times daily.     clopidogrel (PLAVIX) 75 MG tablet Take 1 tablet (75 mg total) by mouth daily. 30 tablet 11   dorzolamidel-timolol (COSOPT) 22.3-6.8 MG/ML SOLN ophthalmic solution Place  1 drop into both eyes at bedtime.     ELIQUIS 5 MG TABS tablet Take 5 mg by mouth 2 (two) times daily.     gabapentin (NEURONTIN) 300 MG capsule Take 300 mg by mouth at bedtime.     hydrALAZINE (APRESOLINE) 100 MG tablet Take 1 tablet (100 mg total) by mouth 3 (three) times daily. 30 tablet 0   hydrochlorothiazide (HYDRODIURIL) 25 MG tablet Take 25 mg by mouth daily.     ibuprofen (ADVIL,MOTRIN) 800 MG tablet Take 800 mg by mouth every 6 (six) hours as needed.     insulin aspart (NOVOLOG) 100 UNIT/ML FlexPen Inject into the skin. 14 units lunch and 22 units at dinner     insulin degludec (TRESIBA FLEXTOUCH) 200 UNIT/ML FlexTouch Pen Inject 55 units daily     isosorbide mononitrate (IMDUR) 30 MG 24 hr tablet Take 1 tablet by mouth daily.     lactulose (CEPHULAC) 10 g packet Take 10 g by mouth 3 (three) times daily as needed.     latanoprost (XALATAN) 0.005 % ophthalmic solution Place 1 drop into both eyes at bedtime.     levothyroxine (SYNTHROID, LEVOTHROID) 88 MCG tablet TAKE 1 TABLET BY MOUTH ONCE DAILY ON AN EMPTY STOMACH  WITH A GLASS OF WATER 30-60 MINUTES BEFORE BREAKFAST     losartan (COZAAR) 100 MG tablet Take 1 tablet by mouth daily.     Multiple Vitamin (MULTI-VITAMINS) TABS Take by mouth.     oxybutynin (DITROPAN-XL) 10 MG 24 hr tablet Take 1 tablet (10 mg total) by mouth daily. 90 tablet 0   tamsulosin (FLOMAX) 0.4 MG CAPS capsule Take 0.4 mg by mouth daily.     insulin NPH Human (HUMULIN N,NOVOLIN N) 100 UNIT/ML injection Inject 20 Units into the skin 2 (two) times  daily before a meal. (Patient not taking: Reported on 04/17/2022)     metoCLOPramide (REGLAN) 5 MG tablet Take 1 tablet (5 mg total) by mouth every 8 (eight) hours as needed for nausea or vomiting. (Patient not taking: Reported on 04/17/2022) 15 tablet 0   omega-3 acid ethyl esters (LOVAZA) 1 g capsule Take 1 g by mouth daily. (Patient not taking: Reported on 04/17/2022)     pregabalin (LYRICA) 100 MG capsule Take by mouth.     ROCKLATAN 0.02-0.005 % SOLN Apply to eye.     torsemide (DEMADEX) 20 MG tablet Take by mouth.     vitamin B-12 (CYANOCOBALAMIN) 1000 MCG tablet Take 1,000 mcg by mouth daily. (Patient not taking: Reported on 04/17/2022)     No current facility-administered medications on file prior to visit.    There are no Patient Instructions on file for this visit. No follow-ups on file.   Kris Hartmann, NP

## 2022-04-30 ENCOUNTER — Ambulatory Visit (INDEPENDENT_AMBULATORY_CARE_PROVIDER_SITE_OTHER): Payer: Medicare Other | Admitting: Physician Assistant

## 2022-04-30 ENCOUNTER — Encounter: Payer: Self-pay | Admitting: Physician Assistant

## 2022-04-30 VITALS — BP 157/73 | HR 71 | Ht 74.0 in | Wt 265.0 lb

## 2022-04-30 DIAGNOSIS — N401 Enlarged prostate with lower urinary tract symptoms: Secondary | ICD-10-CM | POA: Diagnosis not present

## 2022-04-30 DIAGNOSIS — R351 Nocturia: Secondary | ICD-10-CM

## 2022-04-30 DIAGNOSIS — R350 Frequency of micturition: Secondary | ICD-10-CM

## 2022-04-30 LAB — BLADDER SCAN AMB NON-IMAGING: Scan Result: 30

## 2022-04-30 NOTE — Progress Notes (Signed)
04/30/2022 2:31 PM   Bruce Bartlett 07/09/1956 [id number]  CC: Chief Complaint  Patient presents with   Benign Prostatic Hypertrophy   HPI: Bruce Bartlett is a 66 y.o. male with PMH BPH with urinary frequency on Flomax, nocturia, diabetes, CKD, and CVA who presents today for symptom recheck on oxybutynin XL 10 mg daily.  He was previously prescribed Myrbetriq, but had to stop this due to cost. He is accompanied today by his wife, who contributes to HPI.  Today he reports he has only been taking the oxybutynin for about one week, but so far it seems to be working well. He has not noticed any constipation, dry mouth, or dry eye. He continues to have nocturia x3-4; his wife reports he is a snorer and she used to notice him stop breathing while asleep, but this hasn't been happening as much lately. PVR 65mL.  PMH: Past Medical History:  Diagnosis Date   Cardiomyopathy ([place])    Chronic kidney disease    CKD (chronic kidney disease) stage 3, GFR 30-59 ml/min (HCC)    Diabetes mellitus without complication (HCC)    Dyspnea    Hyperlipidemia    Hypertension    Hypothyroidism    Peripheral vascular disease ([place])    Stroke [facility])     Surgical History: Past Surgical History:  Procedure Laterality Date   BACK SURGERY     LOWER EXTREMITY ANGIOGRAPHY Left 09/20/2021   Procedure: Lower Extremity Angiography;  Surgeon: Algernon Huxley, MD;  Location: [facility];  Service: Cardiovascular;  Laterality: Left;   PACEMAKER INSERTION N/A 10/31/2016   Procedure: INSERTION PACEMAKER;  Surgeon: Isaias Cowman, MD;  Location: [facility];  Service: Cardiovascular;  Laterality: N/A;   TOE AMPUTATION Right     Home Medications:  Allergies as of 04/30/2022   No Known Allergies      Medication List        Accurate as of April 30, 2022  2:31 PM. If you have any questions, ask your nurse or doctor.          amLODipine 5 MG tablet Commonly known as: NORVASC Take 5 tablets by  mouth daily.   aspirin EC 81 MG tablet Take 1 tablet (81 mg total) by mouth daily.   atorvastatin 40 MG tablet Commonly known as: LIPITOR Take 1 tablet by mouth daily.   brimonidine 0.2 % ophthalmic solution Commonly known as: ALPHAGAN Place 1 drop into both eyes 2 (two) times daily.   carvedilol 3.125 MG tablet Commonly known as: COREG Take 3.125 mg by mouth 2 (two) times daily.   clopidogrel 75 MG tablet Commonly known as: Plavix Take 1 tablet (75 mg total) by mouth daily.   cyanocobalamin 1000 MCG tablet Commonly known as: VITAMIN B12 Take 1,000 mcg by mouth daily.   dorzolamidel-timolol 22.3-6.8 MG/ML Soln ophthalmic solution Commonly known as: COSOPT Place 1 drop into both eyes at bedtime.   Eliquis 5 MG Tabs tablet Generic drug: apixaban Take 5 mg by mouth 2 (two) times daily.   gabapentin 300 MG capsule Commonly known as: NEURONTIN Take 300 mg by mouth at bedtime.   hydrALAZINE 100 MG tablet Commonly known as: APRESOLINE Take 1 tablet (100 mg total) by mouth 3 (three) times daily.   hydrochlorothiazide 25 MG tablet Commonly known as: HYDRODIURIL Take 25 mg by mouth daily.   ibuprofen 800 MG tablet Commonly known as: ADVIL Take 800 mg by mouth every 6 (six) hours as needed.   insulin  aspart 100 UNIT/ML FlexPen Commonly known as: NOVOLOG Inject into the skin. 14 units lunch and 22 units at dinner   insulin NPH Human 100 UNIT/ML injection Commonly known as: NOVOLIN N Inject 20 Units into the skin 2 (two) times daily before a meal.   isosorbide mononitrate 30 MG 24 hr tablet Commonly known as: IMDUR Take 1 tablet by mouth daily.   lactulose 10 g packet Commonly known as: CEPHULAC Take 10 g by mouth 3 (three) times daily as needed.   latanoprost 0.005 % ophthalmic solution Commonly known as: XALATAN Place 1 drop into both eyes at bedtime.   levothyroxine 88 MCG tablet Commonly known as: SYNTHROID TAKE 1 TABLET BY MOUTH ONCE DAILY ON AN EMPTY  STOMACH  WITH A GLASS OF WATER 30-60 MINUTES BEFORE BREAKFAST   losartan 100 MG tablet Commonly known as: COZAAR Take 1 tablet by mouth daily.   metoCLOPramide 5 MG tablet Commonly known as: Reglan Take 1 tablet (5 mg total) by mouth every 8 (eight) hours as needed for nausea or vomiting.   Multi-Vitamins Tabs Take by mouth.   omega-3 acid ethyl esters 1 g capsule Commonly known as: LOVAZA Take 1 g by mouth daily.   oxybutynin 10 MG 24 hr tablet Commonly known as: DITROPAN-XL Take 1 tablet (10 mg total) by mouth daily.   pregabalin 100 MG capsule Commonly known as: LYRICA Take by mouth.   Rocklatan 0.02-0.005 % Soln Generic drug: Netarsudil-Latanoprost Apply to eye.   tamsulosin 0.4 MG Caps capsule Commonly known as: FLOMAX Take 0.4 mg by mouth daily.   torsemide 20 MG tablet Commonly known as: DEMADEX Take by mouth.   Tyler Aas FlexTouch 200 UNIT/ML FlexTouch Pen Generic drug: insulin degludec Inject 55 units daily        Allergies:  No Known Allergies  Family History: Family History  Problem Relation Age of Onset   Diabetes Mother    Stroke Mother    Cancer Father     Social History:   reports that he has quit smoking. He has never used smokeless tobacco. He reports that he does not drink alcohol and does not use drugs.  Physical Exam: There were no vitals taken for this visit.  Constitutional:  Alert and oriented, no acute distress, nontoxic appearing HEENT: [place], AT Cardiovascular: No clubbing, cyanosis, or edema Respiratory: Normal respiratory effort, no increased work of breathing Skin: No rashes, bruises or suspicious lesions Neurologic: Grossly intact, no focal deficits, moving all 4 extremities Psychiatric: Normal mood and affect  Laboratory Data: Results for orders placed or performed in visit on 04/30/22  Bladder Scan (Post Void Residual) in office  Result Value Ref Range   Scan Result 30    Assessment & Plan:   1. Benign prostatic  hyperplasia with urinary frequency No acute concerns on oxybutynin XL x1 week. He is emptying appropriately. We discussed consideration of alternative pharmacotherapy if he develops anticholinergic side effects. Will continue oxybutynin for now.  - Bladder Scan (Post Void Residual) in office  2. Nocturia Agree with prior recommendations to consider sleep study for possible OSA. We discussed the role of untreated sleep apnea in urine production overnight.  Return if symptoms worsen or fail to improve.  Debroah Loop, PA-C  [facility] [address] [place], [place] [address] [phone]

## 2022-05-20 ENCOUNTER — Other Ambulatory Visit: Payer: Self-pay | Admitting: Urology

## 2022-07-15 IMAGING — CT CT HEAD W/O CM
3 of 4 series · 13 of 47 positions shown, 15 images · non-contrast
Comparison: MRI 07/29/2019

CLINICAL DATA: Gait disturbance. Right arm numbness and tingling.
Six months duration.

EXAM:
CT HEAD WITHOUT CONTRAST
TECHNIQUE: Contiguous axial images were obtained from the base of the skull
through the vertex without intravenous contrast.

[Series 2: axial st head 5.00 ax · axial · 0.37mm/px · z∈[-530,-430]mm · 7 of 28 slices shown, 9 images]
[im 4/28  brain]
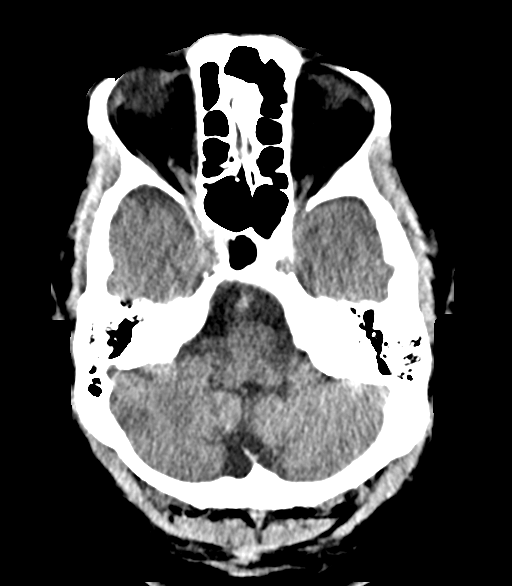
[im 4/28  bone]
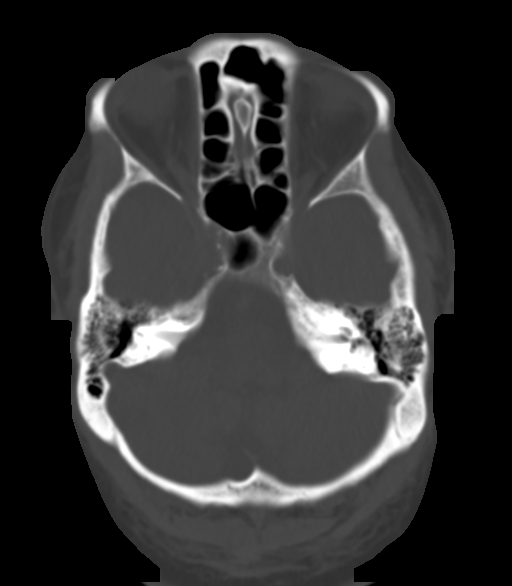
[im 7/28  brain]
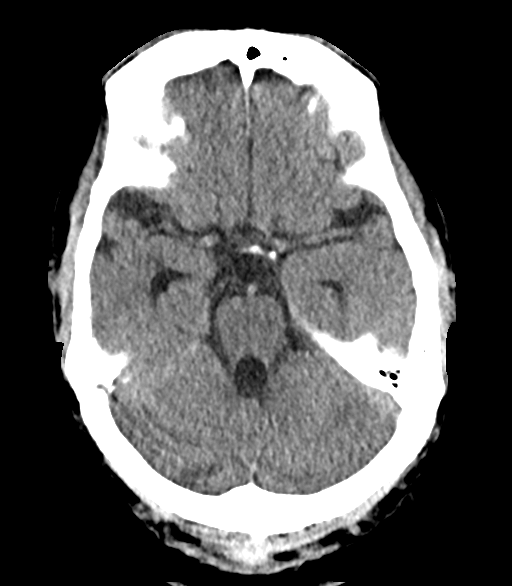
[im 11/28  brain]
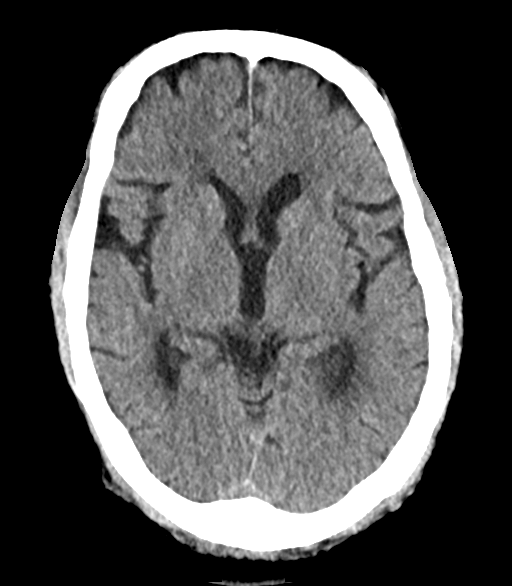
[im 14/28  brain]
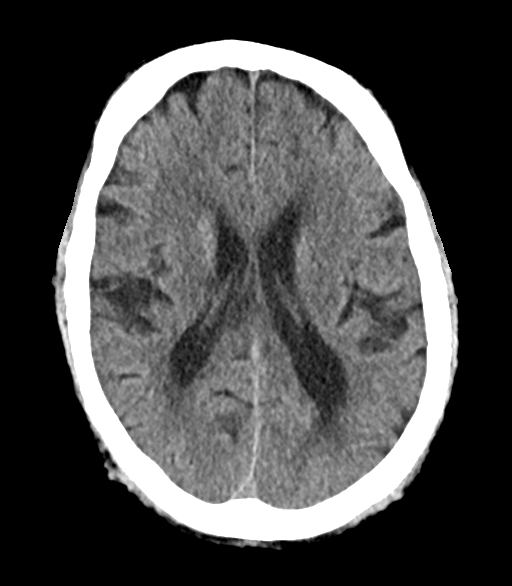
[im 17/28  brain]
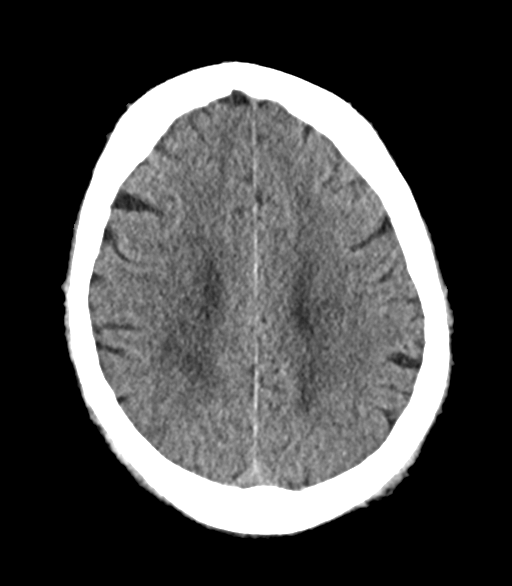
[im 17/28  bone]
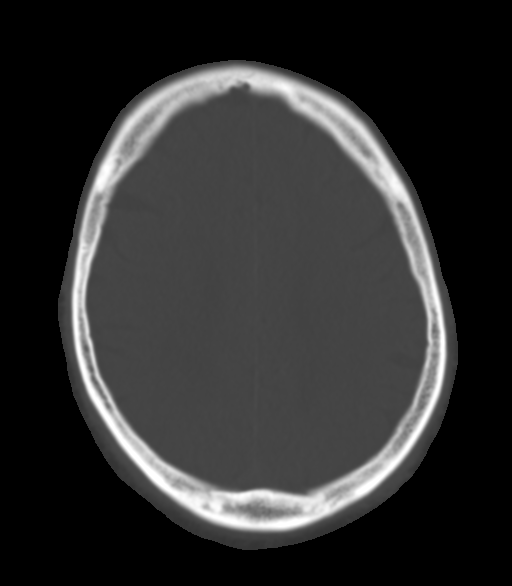
[im 21/28  brain]
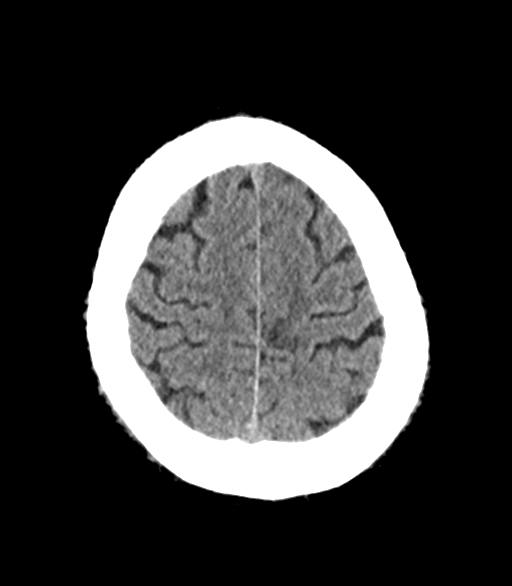
[im 24/28  brain]
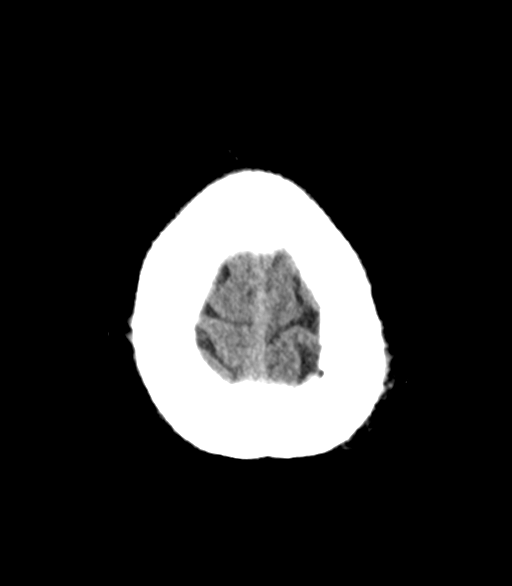

[Series 6: coronals head 3.00 cor · coronal · 0.27mm/px · 3 of 72 slices shown]
[im 24/72  brain]
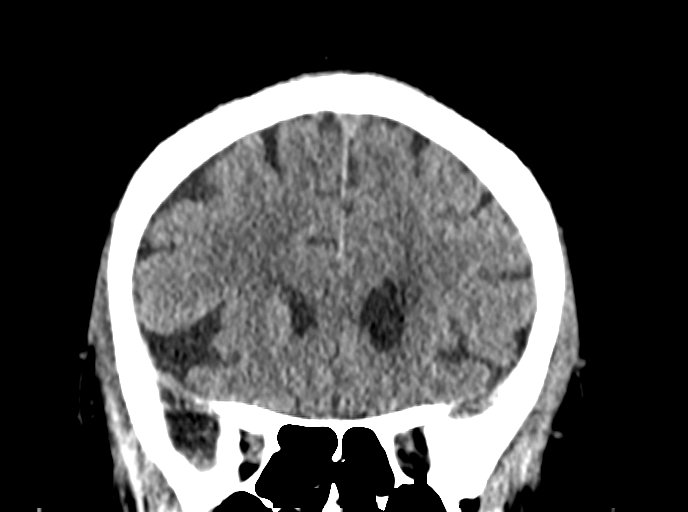
[im 32/72  brain]
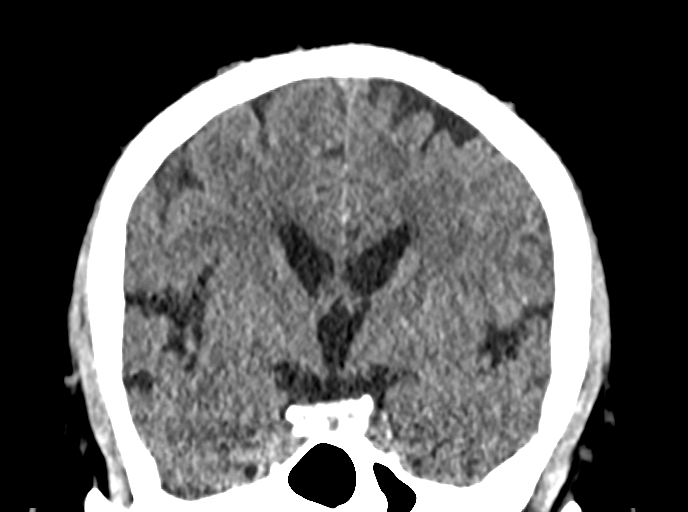
[im 40/72  brain]
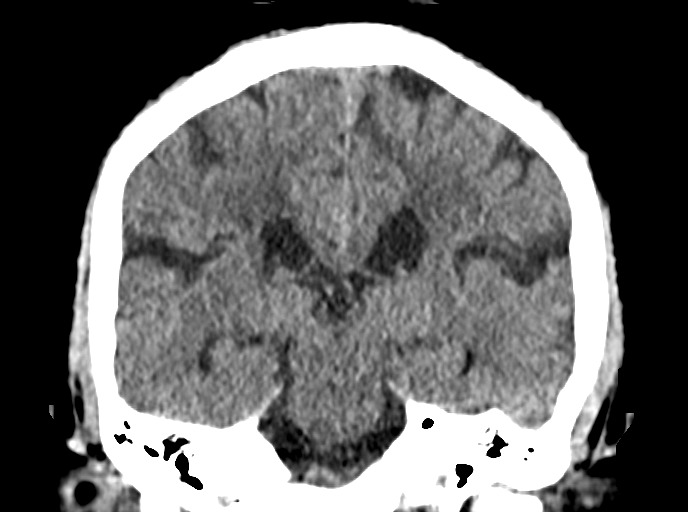

[Series 8: sagittals head 3.00 sag · sagittal · 0.27mm/px · 3 of 62 slices shown]
[im 21/62  brain]
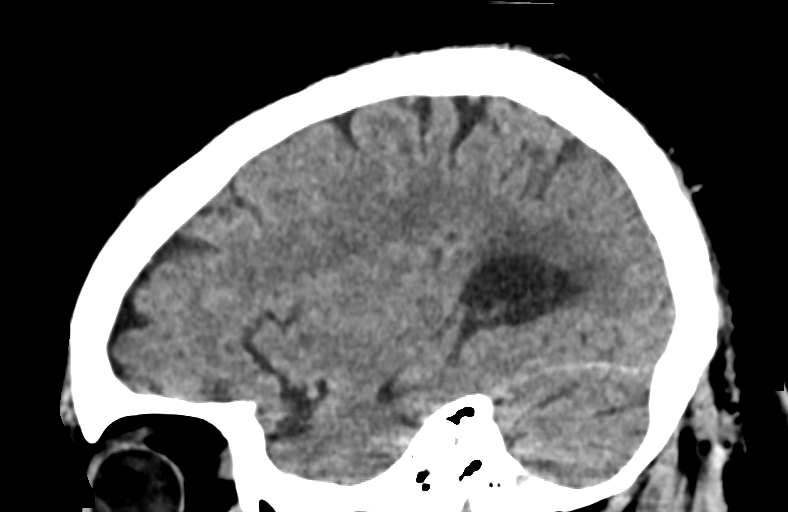
[im 31/62  brain]
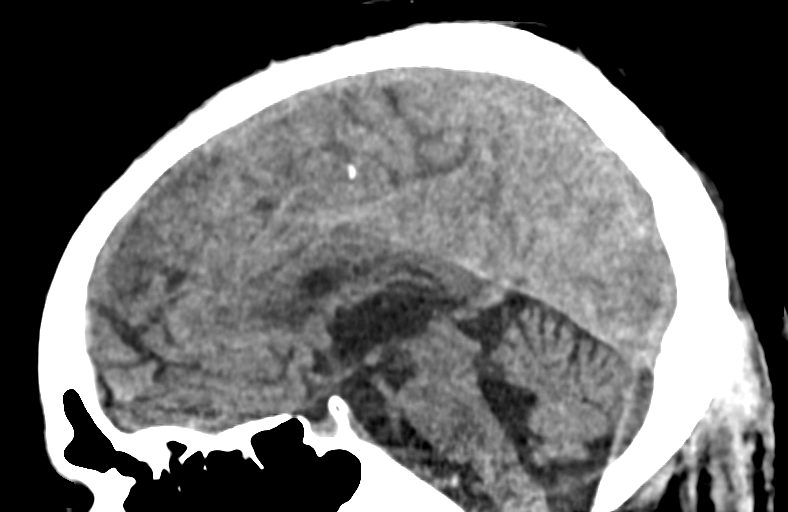
[im 41/62  brain]
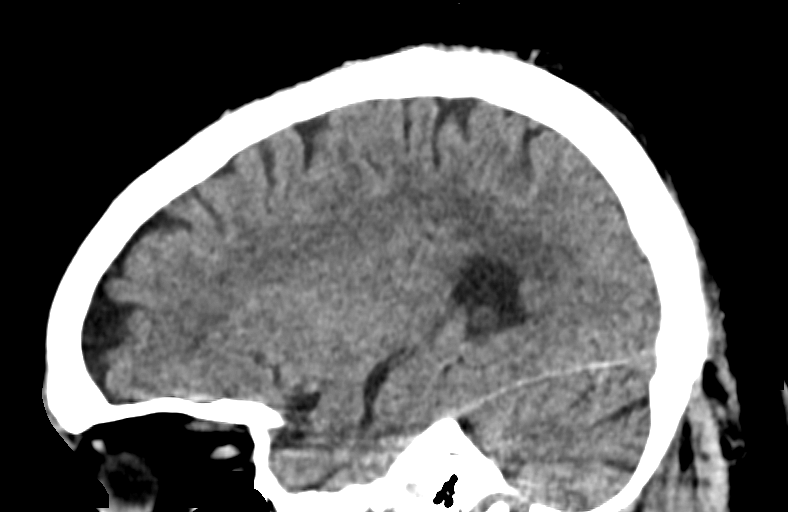

[13 of 47 positions shown; findings below may reference images not displayed]

FINDINGS: Brain: No focal abnormality affects the brainstem or cerebellum.
Some cerebellar atrophy. Cerebral hemispheres show moderate chronic
small-vessel ischemic changes of the white matter and generalized
volume loss. There is an old left parietal vertex cortical and
subcortical infarction. No sign of acute infarction, mass lesion,
hemorrhage, hydrocephalus or extra-axial collection.

Vascular: There is atherosclerotic calcification of the major
vessels at the base of the brain.

Skull: Negative

Sinuses/Orbits: Clear/normal

Other: None
IMPRESSION: No acute finding by CT. Atrophy and chronic small-vessel ischemic
changes of the white matter. Old left parietal vertex cortical and
subcortical infarction.

## 2022-10-09 ENCOUNTER — Other Ambulatory Visit (INDEPENDENT_AMBULATORY_CARE_PROVIDER_SITE_OTHER): Payer: Self-pay | Admitting: Nurse Practitioner

## 2022-10-09 DIAGNOSIS — Z9889 Other specified postprocedural states: Secondary | ICD-10-CM

## 2022-10-15 ENCOUNTER — Ambulatory Visit (INDEPENDENT_AMBULATORY_CARE_PROVIDER_SITE_OTHER): Payer: Medicare Other | Admitting: Vascular Surgery

## 2022-10-15 ENCOUNTER — Encounter (INDEPENDENT_AMBULATORY_CARE_PROVIDER_SITE_OTHER): Payer: Medicare Other

## 2022-11-15 ENCOUNTER — Other Ambulatory Visit (INDEPENDENT_AMBULATORY_CARE_PROVIDER_SITE_OTHER): Payer: Self-pay | Admitting: Nurse Practitioner

## 2022-11-15 DIAGNOSIS — I739 Peripheral vascular disease, unspecified: Secondary | ICD-10-CM

## 2022-11-19 ENCOUNTER — Ambulatory Visit (INDEPENDENT_AMBULATORY_CARE_PROVIDER_SITE_OTHER): Payer: Medicare Other

## 2022-11-19 ENCOUNTER — Ambulatory Visit (INDEPENDENT_AMBULATORY_CARE_PROVIDER_SITE_OTHER): Payer: Medicare Other | Admitting: Vascular Surgery

## 2022-11-19 VITALS — BP 172/86 | HR 67

## 2022-11-19 DIAGNOSIS — I420 Dilated cardiomyopathy: Secondary | ICD-10-CM | POA: Diagnosis not present

## 2022-11-19 DIAGNOSIS — I70211 Atherosclerosis of native arteries of extremities with intermittent claudication, right leg: Secondary | ICD-10-CM | POA: Diagnosis not present

## 2022-11-19 DIAGNOSIS — I739 Peripheral vascular disease, unspecified: Secondary | ICD-10-CM | POA: Diagnosis not present

## 2022-11-19 DIAGNOSIS — N183 Chronic kidney disease, stage 3 unspecified: Secondary | ICD-10-CM

## 2022-11-19 DIAGNOSIS — I10 Essential (primary) hypertension: Secondary | ICD-10-CM | POA: Diagnosis not present

## 2022-11-19 DIAGNOSIS — Z9889 Other specified postprocedural states: Secondary | ICD-10-CM

## 2022-11-19 DIAGNOSIS — E119 Type 2 diabetes mellitus without complications: Secondary | ICD-10-CM | POA: Diagnosis not present

## 2022-11-19 NOTE — Assessment & Plan Note (Signed)
Avoid contrast unless the patient develops limb threatening symptoms.

## 2022-11-19 NOTE — Assessment & Plan Note (Signed)
blood pressure control important in reducing the progression of atherosclerotic disease. On appropriate oral medications.  

## 2022-11-19 NOTE — Progress Notes (Signed)
MRN : [id number]  Bruce Bartlett is a 67 y.o. (1955/10/05) male who presents with chief complaint of  Chief Complaint  Patient presents with   Follow-up  .  History of Present Illness: Patient returns today in follow up of his PAD.  He underwent right lower extremity revascularization about a year ago.  He has had chronic swelling in that leg but his pain is better.  No ulcerations or infections.  No pain worrisome for rest pain or extremity pain.  Noninvasive studies show a stable right ABI of 0.93 and a drop in the left ABI 0.62.  Current Outpatient Medications  Medication Sig Dispense Refill   amLODipine (NORVASC) 5 MG tablet Take 5 tablets by mouth daily.     atorvastatin (LIPITOR) 40 MG tablet Take 1 tablet by mouth daily.     brimonidine (ALPHAGAN) 0.2 % ophthalmic solution Place 1 drop into both eyes 2 (two) times daily.     carvedilol (COREG) 3.125 MG tablet Take 3.125 mg by mouth 2 (two) times daily.     dorzolamidel-timolol (COSOPT) 22.3-6.8 MG/ML SOLN ophthalmic solution Place 1 drop into both eyes at bedtime.     ELIQUIS 5 MG TABS tablet Take 5 mg by mouth 2 (two) times daily.     gabapentin (NEURONTIN) 300 MG capsule Take 300 mg by mouth at bedtime.     hydrALAZINE (APRESOLINE) 100 MG tablet Take 1 tablet (100 mg total) by mouth 3 (three) times daily. 30 tablet 0   hydrochlorothiazide (HYDRODIURIL) 25 MG tablet Take 25 mg by mouth daily.     ibuprofen (ADVIL,MOTRIN) 800 MG tablet Take 800 mg by mouth every 6 (six) hours as needed.     insulin aspart (NOVOLOG) 100 UNIT/ML FlexPen Inject into the skin. 14 units lunch and 22 units at dinner     insulin degludec (TRESIBA FLEXTOUCH) 200 UNIT/ML FlexTouch Pen Inject 55 units daily     isosorbide mononitrate (IMDUR) 30 MG 24 hr tablet Take 1 tablet by mouth daily.     lactulose (CEPHULAC) 10 g packet Take 10 g by mouth 3 (three) times daily as needed.     latanoprost (XALATAN) 0.005 % ophthalmic solution Place 1 drop into both  eyes at bedtime.     levothyroxine (SYNTHROID, LEVOTHROID) 88 MCG tablet TAKE 1 TABLET BY MOUTH ONCE DAILY ON AN EMPTY STOMACH  WITH A GLASS OF WATER 30-60 MINUTES BEFORE BREAKFAST     losartan (COZAAR) 100 MG tablet Take 1 tablet by mouth daily.     metoCLOPramide (REGLAN) 5 MG tablet Take 1 tablet (5 mg total) by mouth every 8 (eight) hours as needed for nausea or vomiting. 15 tablet 0   Multiple Vitamin (MULTI-VITAMINS) TABS Take by mouth.     omega-3 acid ethyl esters (LOVAZA) 1 g capsule Take 1 g by mouth daily.     tamsulosin (FLOMAX) 0.4 MG CAPS capsule Take 0.4 mg by mouth daily.     aspirin EC 81 MG tablet Take 1 tablet (81 mg total) by mouth daily. (Patient not taking: Reported on 11/19/2022) 150 tablet 2   clopidogrel (PLAVIX) 75 MG tablet Take 1 tablet (75 mg total) by mouth daily. (Patient not taking: Reported on 11/19/2022) 30 tablet 11   insulin NPH Human (HUMULIN N,NOVOLIN N) 100 UNIT/ML injection Inject 20 Units into the skin 2 (two) times daily before a meal. (Patient not taking: Reported on 11/19/2022)     oxybutynin (DITROPAN-XL) 10 MG 24 hr tablet TAKE 1 TABLET  BY MOUTH DAILY (Patient not taking: Reported on 11/19/2022) 90 tablet 3   pregabalin (LYRICA) 100 MG capsule Take by mouth.     ROCKLATAN 0.02-0.005 % SOLN Apply to eye. (Patient not taking: Reported on 11/19/2022)     torsemide (DEMADEX) 20 MG tablet Take by mouth.     vitamin B-12 (CYANOCOBALAMIN) 1000 MCG tablet Take 1,000 mcg by mouth daily. (Patient not taking: Reported on 11/19/2022)     No current facility-administered medications for this visit.    Past Medical History:  Diagnosis Date   Cardiomyopathy (HCC)    Chronic kidney disease    CKD (chronic kidney disease) stage 3, GFR 30-59 ml/min (HCC)    Diabetes mellitus without complication (HCC)    Dyspnea    Hyperlipidemia    Hypertension    Hypothyroidism    Peripheral vascular disease (HCC)    Stroke [facility])     Past Surgical History:  Procedure  Laterality Date   BACK SURGERY     LOWER EXTREMITY ANGIOGRAPHY Left 09/20/2021   Procedure: Lower Extremity Angiography;  Surgeon: Annice Needy, MD;  Location: [facility] INVASIVE CV LAB;  Service: Cardiovascular;  Laterality: Left;   PACEMAKER INSERTION N/A 10/31/2016   Procedure: INSERTION PACEMAKER;  Surgeon: Marcina Millard, MD;  Location: [facility] ORS;  Service: Cardiovascular;  Laterality: N/A;   TOE AMPUTATION Right      Social History   Tobacco Use   Smoking status: Former   Smokeless tobacco: Never  Substance Use Topics   Alcohol use: No   Drug use: No       Family History  Problem Relation Age of Onset   Diabetes Mother    Stroke Mother    Cancer Father      No Known Allergies   REVIEW OF SYSTEMS (Negative unless checked)  Constitutional: [] Weight loss  [] Fever  [] Chills Cardiac: [] Chest pain   [] Chest pressure   [] Palpitations   [] Shortness of breath when laying flat   [] Shortness of breath at rest   [] Shortness of breath with exertion. Vascular:  [] Pain in legs with walking   [] Pain in legs at rest   [] Pain in legs when laying flat   [] Claudication   [] Pain in feet when walking  [] Pain in feet at rest  [] Pain in feet when laying flat   [] History of DVT   [] Phlebitis   [x] Swelling in legs   [] Varicose veins   [] Non-healing ulcers Pulmonary:   [] Uses home oxygen   [] Productive cough   [] Hemoptysis   [] Wheeze  [] COPD   [] Asthma Neurologic:  [] Dizziness  [] Blackouts   [] Seizures   [x] History of stroke   [] History of TIA  [] Aphasia   [] Temporary blindness   [] Dysphagia   [] Weakness or numbness in arms   [] Weakness or numbness in legs Musculoskeletal:  [x] Arthritis   [] Joint swelling   [x] Joint pain   [] Low back pain Hematologic:  [] Easy bruising  [] Easy bleeding   [] Hypercoagulable state   [] Anemic   Gastrointestinal:  [] Blood in stool   [] Vomiting blood  [] Gastroesophageal reflux/heartburn   [] Abdominal pain Genitourinary:  [x] Chronic kidney disease   [] Difficult urination   [] Frequent urination  [] Burning with urination   [] Hematuria Skin:  [] Rashes   [] Ulcers   [] Wounds Psychological:  [] History of anxiety   []  History of major depression.  Physical Examination  BP (!) 172/86 (BP Location: Right Arm)   Pulse 67  Gen:  WD/WN, NAD.  Appears older than stated age Head: [place]/AT, No temporalis wasting. Ear/Nose/Throat:  Hearing grossly intact, nares w/o erythema or drainage Eyes: Conjunctiva clear. Sclera non-icteric Neck: Supple.  Trachea midline Pulmonary:  Good air movement, no use of accessory muscles.  Cardiac: RRR, no JVD Vascular:  Vessel Right Left  Radial Palpable Palpable                          PT Trace palpable 1+ palpable  DP 1+ palpable 1+ palpable   Gastrointestinal: soft, non-tender/non-distended. No guarding/reflex.  Musculoskeletal: M/S 5/5 throughout.  No deformity or atrophy.  In a wheelchair.  1-2+ right lower extremity edema, trace left lower extremity edema.  Stasis dermatitis changes are present bilaterally. Neurologic: Sensation grossly intact in extremities.  Symmetrical.  Speech is fluent.  Psychiatric: Judgment intact, Mood & affect appropriate for pt's clinical situation. Dermatologic: No rashes or ulcers noted.  No cellulitis or open wounds.      Labs No results found for this or any previous visit (from the past 2160 hour(s)).  Radiology No results found.  Assessment/Plan  Atherosclerosis of native arteries of extremity with intermittent claudication (HCC) Noninvasive studies show a stable right ABI of 0.93 and a drop in the left ABI 0.62.  No current limb threatening symptoms.  Would recommend dual antiplatelet therapy with aspirin and Plavix as well as a statin agent.  Recheck in 6 months with noninvasive studies.  Dilated cardiomyopathy (HCC) Poor cardiac function can contribute to lower extremity swelling as well as poor lower extremity perfusion.  Essential (primary) hypertension blood pressure control  important in reducing the progression of atherosclerotic disease. On appropriate oral medications.   Diabetes mellitus type 2, uncomplicated (HCC) blood glucose control important in reducing the progression of atherosclerotic disease. Also, involved in wound healing. On appropriate medications.   CKD (chronic kidney disease) stage 3, GFR 30-59 ml/min Avoid contrast unless the patient develops limb threatening symptoms.    [name], MD  11/19/2022 2:45 PM    This note was created with Dragon medical transcription system.  Any errors from dictation are purely unintentional

## 2022-11-19 NOTE — Assessment & Plan Note (Signed)
Poor cardiac function can contribute to lower extremity swelling as well as poor lower extremity perfusion.

## 2022-11-19 NOTE — Assessment & Plan Note (Signed)
blood glucose control important in reducing the progression of atherosclerotic disease. Also, involved in wound healing. On appropriate medications.  

## 2022-11-19 NOTE — Assessment & Plan Note (Signed)
Noninvasive studies show a stable right ABI of 0.93 and a drop in the left ABI 0.62.  No current limb threatening symptoms.  Would recommend dual antiplatelet therapy with aspirin and Plavix as well as a statin agent.  Recheck in 6 months with noninvasive studies.

## 2022-11-20 NOTE — Telephone Encounter (Signed)
Error

## 2022-11-21 LAB — VAS US ABI WITH/WO TBI
Left ABI: 0.62
Right ABI: 0.93

## 2022-12-10 ENCOUNTER — Other Ambulatory Visit: Payer: Self-pay | Admitting: Internal Medicine

## 2022-12-10 DIAGNOSIS — R140 Abdominal distension (gaseous): Secondary | ICD-10-CM

## 2022-12-11 ENCOUNTER — Ambulatory Visit
Admission: RE | Admit: 2022-12-11 | Discharge: 2022-12-11 | Disposition: A | Discharge status: Home / Self Care | Payer: Medicare Other | Source: Ambulatory Visit | Attending: Internal Medicine | Admitting: Internal Medicine

## 2022-12-11 DIAGNOSIS — R140 Abdominal distension (gaseous): Secondary | ICD-10-CM | POA: Diagnosis not present

## 2023-03-12 ENCOUNTER — Ambulatory Visit: Payer: Medicare Other | Admitting: Urology

## 2023-03-19 ENCOUNTER — Ambulatory Visit: Payer: Medicare Other | Admitting: Urology

## 2023-03-23 DEATH — deceased

## 2023-05-20 ENCOUNTER — Ambulatory Visit (INDEPENDENT_AMBULATORY_CARE_PROVIDER_SITE_OTHER): Payer: Medicare Other | Admitting: Vascular Surgery

## 2023-05-20 ENCOUNTER — Encounter (INDEPENDENT_AMBULATORY_CARE_PROVIDER_SITE_OTHER): Payer: Medicare Other
